# Patient Record
Sex: Female | Born: 1960
Health system: Southern US, Community
[De-identification: ages and names within clinical notes are randomized; demographics above are authoritative.]

## PROBLEM LIST (undated history)

## (undated) DIAGNOSIS — R519 Headache, unspecified: Secondary | ICD-10-CM

## (undated) DIAGNOSIS — M1990 Unspecified osteoarthritis, unspecified site: Secondary | ICD-10-CM

## (undated) DIAGNOSIS — E079 Disorder of thyroid, unspecified: Secondary | ICD-10-CM

## (undated) DIAGNOSIS — C4491 Basal cell carcinoma of skin, unspecified: Secondary | ICD-10-CM

## (undated) DIAGNOSIS — C50919 Malignant neoplasm of unspecified site of unspecified female breast: Secondary | ICD-10-CM

## (undated) DIAGNOSIS — Z923 Personal history of irradiation: Secondary | ICD-10-CM

## (undated) DIAGNOSIS — R609 Edema, unspecified: Secondary | ICD-10-CM

## (undated) DIAGNOSIS — E785 Hyperlipidemia, unspecified: Secondary | ICD-10-CM

## (undated) DIAGNOSIS — R51 Headache: Secondary | ICD-10-CM

## (undated) DIAGNOSIS — D649 Anemia, unspecified: Secondary | ICD-10-CM

## (undated) HISTORY — PX: ABDOMINAL HYSTERECTOMY: SHX81

## (undated) HISTORY — PX: KNEE ARTHROSCOPY: SHX127

## (undated) HISTORY — DX: Anemia, unspecified: D64.9

## (undated) HISTORY — DX: Hyperlipidemia, unspecified: E78.5

## (undated) HISTORY — PX: EYE SURGERY: SHX253

## (undated) HISTORY — DX: Disorder of thyroid, unspecified: E07.9

## (undated) HISTORY — DX: Basal cell carcinoma of skin, unspecified: C44.91

## (undated) HISTORY — DX: Edema, unspecified: R60.9

## (undated) HISTORY — DX: Headache, unspecified: R51.9

## (undated) HISTORY — PX: MANDIBLE SURGERY: SHX707

## (undated) HISTORY — PX: BASAL CELL CARCINOMA EXCISION: SHX1214

## (undated) HISTORY — DX: Unspecified osteoarthritis, unspecified site: M19.90

## (undated) HISTORY — PX: TUBAL LIGATION: SHX77

## (undated) HISTORY — PX: COSMETIC SURGERY: SHX468

## (undated) HISTORY — DX: Malignant neoplasm of unspecified site of unspecified female breast: C50.919

## (undated) HISTORY — DX: Headache: R51

---

## 1996-01-26 HISTORY — PX: BREAST BIOPSY: SHX20

## 2003-01-26 DIAGNOSIS — C50919 Malignant neoplasm of unspecified site of unspecified female breast: Secondary | ICD-10-CM

## 2003-01-26 DIAGNOSIS — Z923 Personal history of irradiation: Secondary | ICD-10-CM

## 2003-01-26 HISTORY — PX: BREAST BIOPSY: SHX20

## 2003-01-26 HISTORY — PX: REDUCTION MAMMAPLASTY: SUR839

## 2003-01-26 HISTORY — PX: BREAST LUMPECTOMY: SHX2

## 2003-01-26 HISTORY — DX: Malignant neoplasm of unspecified site of unspecified female breast: C50.919

## 2003-01-26 HISTORY — DX: Personal history of irradiation: Z92.3

## 2004-01-26 HISTORY — PX: PARTIAL HYSTERECTOMY: SHX80

## 2016-01-26 HISTORY — PX: FOOT SURGERY: SHX648

## 2017-10-07 ENCOUNTER — Ambulatory Visit: Payer: Commercial Managed Care - PPO | Admitting: Primary Care

## 2017-10-07 ENCOUNTER — Encounter: Payer: Self-pay | Admitting: Primary Care

## 2017-10-07 VITALS — BP 118/72 | HR 65 | Temp 98.0°F | Ht 67.0 in | Wt 133.0 lb

## 2017-10-07 DIAGNOSIS — Z23 Encounter for immunization: Secondary | ICD-10-CM | POA: Diagnosis not present

## 2017-10-07 DIAGNOSIS — M79602 Pain in left arm: Secondary | ICD-10-CM | POA: Insufficient documentation

## 2017-10-07 DIAGNOSIS — R600 Localized edema: Secondary | ICD-10-CM

## 2017-10-07 HISTORY — DX: Pain in left arm: M79.602

## 2017-10-07 MED ORDER — PREDNISONE 20 MG PO TABS: ORAL_TABLET | ORAL | 0 refills | Status: DC

## 2017-10-07 NOTE — Assessment & Plan Note (Signed)
Chronic for years, managed on HCTZ for this.  Continue same. BP stable. Monitor BMP.

## 2017-10-07 NOTE — Progress Notes (Signed)
Subjective:    Patient ID: Hannah Rivas, female    DOB: Jul 01, 1960, 57 y.o.   MRN: [id number]  HPI  [name] is a 57 year old female who presents today to establish care and discuss the problems mentioned below. Will obtain old records.   1) Excess Edema: Located to bilateral upper and lower extremities. diagnosed years ago and was told that this was hormonal. Currently managed on HCTZ 25 mg and feels well managed.   BP Readings from Last 3 Encounters:  10/07/17 118/72   2) Left Extremity Pain: Symptoms of discomfort to the left upper trapezius, initially started with "knots to the back" in that area. She went for massage which resolved the knots. Also with pain and numbness/tingling to the left mid upper arm with radiation down to the ulnar wrist.   She's been taking Advil 600 mg BID daily for two weeks with some improvement. She's also be resting by not going to exercise class. She thinks her symptoms are secondary to frequently laying onto her left side on the floor as she had no furniture. She recently moved from [place] and had her furniture moved to [place] weeks before they moved to [place]. Given no furniture she had been leaning onto her left arm and elbow on carpet when at home.  She works on Radio producer daily throughout the day. She denies neck and shoulder pain, recent trauma.    Review of Systems  Musculoskeletal: Negative for back pain and neck pain.       Left upper extremity pain  Skin: Negative for color change.  Neurological: Positive for numbness.       Past Medical History:  Diagnosis Date  . Basal cell carcinoma   . Breast cancer ([place]) 2005   Left  . Edema      Social History   Socioeconomic History  . Marital status: Married    Spouse name: Not on file  . Number of children: Not on file  . Years of education: Not on file  . Highest education level: Not on file  Occupational History  . Not on file  Social Needs  . Financial resource strain: Not on  file  . Food insecurity:    Worry: Not on file    Inability: Not on file  . Transportation needs:    Medical: Not on file    Non-medical: Not on file  Tobacco Use  . Smoking status: Never Smoker  . Smokeless tobacco: Never Used  Substance and Sexual Activity  . Alcohol use: Yes  . Drug use: Not on file  . Sexual activity: Not on file  Lifestyle  . Physical activity:    Days per week: Not on file    Minutes per session: Not on file  . Stress: Not on file  Relationships  . Social connections:    Talks on phone: Not on file    Gets together: Not on file    Attends religious service: Not on file    Active member of club or organization: Not on file    Attends meetings of clubs or organizations: Not on file    Relationship status: Not on file  . Intimate partner violence:    Fear of current or ex partner: Not on file    Emotionally abused: Not on file    Physically abused: Not on file    Forced sexual activity: Not on file  Other Topics Concern  . Not on file  Social History Narrative  .  Not on file    Past Surgical History:  Procedure Laterality Date  . BREAST LUMPECTOMY Left 2005  . FOOT SURGERY Left 2018  . PARTIAL HYSTERECTOMY  2006    Family History  Problem Relation Age of Onset  . Heart attack Mother   . Stroke Father   . Glaucoma Father   . Hypertension Father     No Known Allergies  Current Outpatient Medications on File Prior to Visit  Medication Sig Dispense Refill  . hydrochlorothiazide (HYDRODIURIL) 25 MG tablet Take by mouth.     No current facility-administered medications on file prior to visit.     BP 118/72   Pulse 65   Temp 98 F (36.7 C) (Oral)   Ht 5\' 7"  (1.702 m)   Wt 133 lb (60.3 kg)   SpO2 99%   BMI 20.83 kg/m    Objective:   Physical Exam  Constitutional: She appears well-nourished.  Neck: Normal range of motion. Neck supple.  Cardiovascular: Normal rate and regular rhythm.  Respiratory: Effort normal and breath sounds  normal.  Musculoskeletal:       Left shoulder: She exhibits normal range of motion, no tenderness, no pain and normal strength.  5/5 strength to bilateral upper extremities   Skin: Skin is warm and dry.           Assessment & Plan:

## 2017-10-07 NOTE — Assessment & Plan Note (Signed)
Present for the last three weeks, some improvement overall but intermittent tingling. Exam today unremarkable. No evidence of blood clot, neck/shoulder involvement.  Rx for prednisone taper sent to pharmacy. She will update.

## 2017-10-07 NOTE — Patient Instructions (Signed)
Start prednisone tablets for arm numbness/pain. Take 2 tablets daily for 4 days, then 1 tablet daily for 4 days. Avoid Ibuprofen, Aleve, Motrin, naproxen.   Continue to rest your arm for now.   Please update me if your pain persists.   Please schedule a physical with me anytime at your convenience. You may also schedule a lab only appointment 3-4 days prior. We will discuss your lab results in detail during your physical.  It was a pleasure to meet you today! Please don't hesitate to call or message me with any questions. Welcome to [facility]!

## 2017-10-07 NOTE — Addendum Note (Signed)
Addended by: Jacqualin Combes on: 10/07/2017 11:51 AM   Modules accepted: Orders

## 2017-10-10 ENCOUNTER — Telehealth: Payer: Self-pay | Admitting: Primary Care

## 2017-10-10 DIAGNOSIS — M79602 Pain in left arm: Secondary | ICD-10-CM

## 2017-10-10 MED ORDER — PREDNISONE 20 MG PO TABS: ORAL_TABLET | ORAL | 0 refills | Status: DC

## 2017-10-10 NOTE — Telephone Encounter (Signed)
rx resent  

## 2017-10-10 NOTE — Telephone Encounter (Signed)
Copied from [place] [id number]. Topic: Quick Communication - See Telephone Encounter >> Oct 10, 2017  2:31 PM Ivar Drape wrote: CRM for notification. See Telephone encounter for: 10/10/17. Patient stated her predniSONE (DELTASONE) 20 MG tablet medication went to the [facility] but [facility] [address].

## 2017-11-15 ENCOUNTER — Encounter: Payer: Self-pay | Admitting: Primary Care

## 2017-11-15 ENCOUNTER — Ambulatory Visit (INDEPENDENT_AMBULATORY_CARE_PROVIDER_SITE_OTHER): Payer: Commercial Managed Care - PPO | Admitting: Primary Care

## 2017-11-15 VITALS — BP 98/60 | HR 73 | Temp 98.1°F | Ht 66.5 in | Wt 134.0 lb

## 2017-11-15 DIAGNOSIS — R600 Localized edema: Secondary | ICD-10-CM | POA: Diagnosis not present

## 2017-11-15 DIAGNOSIS — Z1239 Encounter for other screening for malignant neoplasm of breast: Secondary | ICD-10-CM

## 2017-11-15 DIAGNOSIS — Z0000 Encounter for general adult medical examination without abnormal findings: Secondary | ICD-10-CM | POA: Diagnosis not present

## 2017-11-15 DIAGNOSIS — Z1159 Encounter for screening for other viral diseases: Secondary | ICD-10-CM | POA: Diagnosis not present

## 2017-11-15 DIAGNOSIS — Z0001 Encounter for general adult medical examination with abnormal findings: Secondary | ICD-10-CM | POA: Insufficient documentation

## 2017-11-15 NOTE — Progress Notes (Signed)
Subjective:    Patient ID: Hannah Rivas, female    DOB: 1960-09-22, 57 y.o.   MRN: [id number]  HPI  Hannah Rivas is a 57 year old female who presents today for complete physical.  Immunizations: -Tetanus: Unsure, thinks it's been over 10 years. Declines.  -Influenza: Completed in 2019   Diet: She endorses a fair diet. Breakfast: Yogurt, fruit Lunch: Salad Dinner: Pizza, vegetables, some starch, some protein  Snacks: Nuts, fruit, popcorn  Desserts: Cookie, three times weekly  Beverages: Water, coffee, milk, cocktail several times weekly  Exercise: Yoga three times weekly, will be going to boot camp. Eye exam: Completed in 2019 Dental exam: Completes semi-annually  Colonoscopy: Due in 2020 Pap Smear: Hysterectomy  Mammogram: Due  Hep C Screen: Due today  BP Readings from Last 3 Encounters:  11/15/17 98/60  10/07/17 118/72     Review of Systems  Constitutional: Negative for unexpected weight change.  HENT: Negative for rhinorrhea.   Respiratory: Negative for cough and shortness of breath.   Cardiovascular: Negative for chest pain.       Intermittent lower extremity edema, chronic.   Gastrointestinal: Negative for constipation and diarrhea.  Genitourinary: Negative for difficulty urinating and menstrual problem.  Musculoskeletal: Negative for arthralgias and myalgias.  Skin: Negative for rash.  Allergic/Immunologic: Negative for environmental allergies.  Neurological: Negative for dizziness, numbness and headaches.  Psychiatric/Behavioral: The patient is not nervous/anxious.        Past Medical History:  Diagnosis Date  . Arthritis   . Basal cell carcinoma   . Breast cancer ([place]) 2005   Left  . Edema   . Frequent headaches      Social History   Socioeconomic History  . Marital status: Married    Spouse name: Not on file  . Number of children: Not on file  . Years of education: Not on file  . Highest education level: Not on file  Occupational  History  . Not on file  Social Needs  . Financial resource strain: Not on file  . Food insecurity:    Worry: Not on file    Inability: Not on file  . Transportation needs:    Medical: Not on file    Non-medical: Not on file  Tobacco Use  . Smoking status: Never Smoker  . Smokeless tobacco: Never Used  Substance and Sexual Activity  . Alcohol use: Yes  . Drug use: Not on file  . Sexual activity: Not on file  Lifestyle  . Physical activity:    Days per week: Not on file    Minutes per session: Not on file  . Stress: Not on file  Relationships  . Social connections:    Talks on phone: Not on file    Gets together: Not on file    Attends religious service: Not on file    Active member of club or organization: Not on file    Attends meetings of clubs or organizations: Not on file    Relationship status: Not on file  . Intimate partner violence:    Fear of current or ex partner: Not on file    Emotionally abused: Not on file    Physically abused: Not on file    Forced sexual activity: Not on file  Other Topics Concern  . Not on file  Social History Narrative  . Not on file    Past Surgical History:  Procedure Laterality Date  . BASAL CELL CARCINOMA EXCISION  2012, 2015  .  BREAST LUMPECTOMY Left 2005  . FOOT SURGERY Left 2018  . PARTIAL HYSTERECTOMY  2006    Family History  Problem Relation Age of Onset  . Heart attack Mother   . Stroke Father   . Glaucoma Father   . Hypertension Father     No Known Allergies  Current Outpatient Medications on File Prior to Visit  Medication Sig Dispense Refill  . hydrochlorothiazide (HYDRODIURIL) 25 MG tablet Take by mouth.     No current facility-administered medications on file prior to visit.     BP 98/60 (BP Location: Left Arm, Patient Position: Sitting, Cuff Size: Normal)   Pulse 73   Temp 98.1 F (36.7 C) (Oral)   Ht 5' 6.5" (1.689 m)   Wt 134 lb (60.8 kg)   SpO2 99%   BMI 21.30 kg/m    Objective:    Physical Exam  Constitutional: She is oriented to person, place, and time. She appears well-nourished.  HENT:  Mouth/Throat: No oropharyngeal exudate.  Eyes: Pupils are equal, round, and reactive to light. EOM are normal.  Neck: Neck supple. No thyromegaly present.  Cardiovascular: Normal rate and regular rhythm.  Respiratory: Effort normal and breath sounds normal.  GI: Soft. Bowel sounds are normal. There is no tenderness.  Musculoskeletal: Normal range of motion.  Neurological: She is alert and oriented to person, place, and time.  Skin: Skin is warm and dry.  Psychiatric: She has a normal mood and affect.           Assessment & Plan:

## 2017-11-15 NOTE — Patient Instructions (Addendum)
Stop by the lab prior to leaving today. I will notify you of your results once received.   Call the [facility] to schedule your mammogram.   Continue exercising. You should be getting 150 minutes of moderate intensity exercise weekly.  It's important to improve your diet by reducing consumption of fast food, fried food, processed snack foods, sugary drinks. Increase consumption of fresh vegetables and fruits, whole grains, water.  Ensure you are drinking 64 ounces of water daily.  We will see you in one year for your annual exam or sooner if needed.  It was a pleasure to see you today!   Preventive Care 40-64 Years, Female Preventive care refers to lifestyle choices and visits with your health care provider that can promote health and wellness. What does preventive care include?  A yearly physical exam. This is also called an annual well check.  Dental exams once or twice a year.  Routine eye exams. Ask your health care provider how often you should have your eyes checked.  Personal lifestyle choices, including: ? Daily care of your teeth and gums. ? Regular physical activity. ? Eating a healthy diet. ? Avoiding tobacco and drug use. ? Limiting alcohol use. ? Practicing safe sex. ? Taking low-dose aspirin daily starting at age 7. ? Taking vitamin and mineral supplements as recommended by your health care provider. What happens during an annual well check? The services and screenings done by your health care provider during your annual well check will depend on your age, overall health, lifestyle risk factors, and family history of disease. Counseling Your health care provider may ask you questions about your:  Alcohol use.  Tobacco use.  Drug use.  Emotional well-being.  Home and relationship well-being.  Sexual activity.  Eating habits.  Work and work Statistician.  Method of birth control.  Menstrual cycle.  Pregnancy history.  Screening You  may have the following tests or measurements:  Height, weight, and BMI.  Blood pressure.  Lipid and cholesterol levels. These may be checked every 5 years, or more frequently if you are over 55 years old.  Skin check.  Lung cancer screening. You may have this screening every year starting at age 61 if you have a 30-pack-year history of smoking and currently smoke or have quit within the past 15 years.  Fecal occult blood test (FOBT) of the stool. You may have this test every year starting at age 52.  Flexible sigmoidoscopy or colonoscopy. You may have a sigmoidoscopy every 5 years or a colonoscopy every 10 years starting at age 98.  Hepatitis C blood test.  Hepatitis B blood test.  Sexually transmitted disease (STD) testing.  Diabetes screening. This is done by checking your blood sugar (glucose) after you have not eaten for a while (fasting). You may have this done every 1-3 years.  Mammogram. This may be done every 1-2 years. Talk to your health care provider about when you should start having regular mammograms. This may depend on whether you have a family history of breast cancer.  BRCA-related cancer screening. This may be done if you have a family history of breast, ovarian, tubal, or peritoneal cancers.  Pelvic exam and Pap test. This may be done every 3 years starting at age 29. Starting at age 2, this may be done every 5 years if you have a Pap test in combination with an HPV test.  Bone density scan. This is done to screen for osteoporosis. You may have this  scan if you are at high risk for osteoporosis.  Discuss your test results, treatment options, and if necessary, the need for more tests with your health care provider. Vaccines Your health care provider may recommend certain vaccines, such as:  Influenza vaccine. This is recommended every year.  Tetanus, diphtheria, and acellular pertussis (Tdap, Td) vaccine. You may need a Td booster every 10 years.  Varicella  vaccine. You may need this if you have not been vaccinated.  Zoster vaccine. You may need this after age 46.  Measles, mumps, and rubella (MMR) vaccine. You may need at least one dose of MMR if you were born in 1957 or later. You may also need a second dose.  Pneumococcal 13-valent conjugate (PCV13) vaccine. You may need this if you have certain conditions and were not previously vaccinated.  Pneumococcal polysaccharide (PPSV23) vaccine. You may need one or two doses if you smoke cigarettes or if you have certain conditions.  Meningococcal vaccine. You may need this if you have certain conditions.  Hepatitis A vaccine. You may need this if you have certain conditions or if you travel or work in places where you may be exposed to hepatitis A.  Hepatitis B vaccine. You may need this if you have certain conditions or if you travel or work in places where you may be exposed to hepatitis B.  Haemophilus influenzae type b (Hib) vaccine. You may need this if you have certain conditions.  Talk to your health care provider about which screenings and vaccines you need and how often you need them. This information is not intended to replace advice given to you by your health care provider. Make sure you discuss any questions you have with your health care provider. Document Released: 02/07/2015 Document Revised: 10/01/2015 Document Reviewed: 11/12/2014 Elsevier Interactive Patient Education  [employer].

## 2017-11-15 NOTE — Assessment & Plan Note (Signed)
Patient endorses Td is UTD. Influenza vaccination UTD. Mammogram due, ordered.  Colonoscopy due in 2020. Discussed to work on diet, continue with regular exercise.  Exam unremarkable. Labs pending. Follow up in 1 year for CPE.

## 2017-11-15 NOTE — Assessment & Plan Note (Signed)
Intermittent, doing well on HCTZ 12.5 mg. Continue to monitor. Discussed to elevate extremities.

## 2017-11-16 LAB — COMPREHENSIVE METABOLIC PANEL
ALK PHOS: 52 U/L (ref 39–117)
ALT: 15 U/L (ref 0–35)
AST: 21 U/L (ref 0–37)
Albumin: 4.5 g/dL (ref 3.5–5.2)
BILIRUBIN TOTAL: 0.3 mg/dL (ref 0.2–1.2)
BUN: 17 mg/dL (ref 6–23)
CALCIUM: 9.8 mg/dL (ref 8.4–10.5)
CO2: 33 mEq/L — ABNORMAL HIGH (ref 19–32)
CREATININE: 0.83 mg/dL (ref 0.40–1.20)
Chloride: 99 mEq/L (ref 96–112)
GFR: 74.79 mL/min (ref >60.00)
Glucose, Bld: 77 mg/dL (ref 70–99)
Potassium: 3.6 mEq/L (ref 3.5–5.1)
Sodium: 140 mEq/L (ref 135–145)
TOTAL PROTEIN: 7.3 g/dL (ref 6.0–8.3)

## 2017-11-16 LAB — LIPID PANEL
CHOL/HDL RATIO: 3
CHOLESTEROL: 216 mg/dL — AB (ref 0–200)
HDL: 78.4 mg/dL (ref >39.00)
LDL Cholesterol: 116 mg/dL — ABNORMAL HIGH (ref 0–99)
NonHDL: 137.93
TRIGLYCERIDES: 111 mg/dL (ref 0.0–149.0)
VLDL: 22.2 mg/dL (ref 0.0–40.0)

## 2017-11-16 LAB — VITAMIN D 25 HYDROXY (VIT D DEFICIENCY, FRACTURES): VITD: 37.85 ng/mL (ref 30.00–100.00)

## 2017-11-16 LAB — HEPATITIS C ANTIBODY
Hepatitis C Ab: NONREACTIVE
SIGNAL TO CUT-OFF: 0.01 (ref <1.00)

## 2017-11-18 ENCOUNTER — Encounter: Payer: Self-pay | Admitting: *Deleted

## 2017-12-05 ENCOUNTER — Other Ambulatory Visit: Payer: Self-pay | Admitting: Primary Care

## 2017-12-05 DIAGNOSIS — N6489 Other specified disorders of breast: Secondary | ICD-10-CM

## 2017-12-08 ENCOUNTER — Telehealth: Payer: Self-pay | Admitting: Primary Care

## 2017-12-08 NOTE — Telephone Encounter (Signed)
Pt dropped off Health Screening form to be filled out for ins. Last cpe 11/15/17. I placed in Rx tower.

## 2017-12-08 NOTE — Telephone Encounter (Signed)
Completed and placed in Chans inbox. 

## 2017-12-08 NOTE — Telephone Encounter (Signed)
Place form/paperwork in Kate Clark's inbox for review and complete if necessary.  

## 2017-12-09 NOTE — Telephone Encounter (Signed)
Spoken to patient and she will stop by for the waist measurement.

## 2017-12-09 NOTE — Telephone Encounter (Signed)
Measurement done. Form faxed.

## 2017-12-27 ENCOUNTER — Ambulatory Visit
Admission: RE | Admit: 2017-12-27 | Discharge: 2017-12-27 | Disposition: A | Discharge status: Home / Self Care | Payer: Commercial Managed Care - PPO | Source: Ambulatory Visit | Attending: Primary Care | Admitting: Primary Care

## 2017-12-27 DIAGNOSIS — N6489 Other specified disorders of breast: Secondary | ICD-10-CM

## 2017-12-27 HISTORY — DX: Personal history of irradiation: Z92.3

## 2017-12-29 ENCOUNTER — Other Ambulatory Visit: Payer: Self-pay

## 2017-12-29 DIAGNOSIS — R600 Localized edema: Secondary | ICD-10-CM

## 2017-12-29 NOTE — Telephone Encounter (Signed)
Noted, please clarify dose with patient. Is she taking 1/2 of a 25 mg tablet or is she taking the full 25 mg tablet? Has she always done this?

## 2017-12-29 NOTE — Telephone Encounter (Signed)
Pt last seen 11/15/17 for annual exam; pt requesting rx for HCTZ 25 mg taking one tab po daily to [facility]. In 11/15/17 office note it was noted pt doing well on  HCTZ 12.5 mg.K Clark NP has not prescribed before. Please advise.

## 2017-12-30 MED ORDER — HYDROCHLOROTHIAZIDE 25 MG PO TABS: 25.0000 mg | ORAL_TABLET | Freq: Every day | ORAL | 3 refills | Status: DC

## 2017-12-30 NOTE — Telephone Encounter (Signed)
Patient called back and she stated that she takes a full 25 mg tablet daily. She did a while back take 12.5 mg but that was before she came here, she start taking the 25 mg.

## 2017-12-30 NOTE — Telephone Encounter (Signed)
Spoken and notified patient of Kate Clark's comments. Patient verbalized understanding.  

## 2017-12-30 NOTE — Telephone Encounter (Signed)
Noted, refill sent to pharmacy. 

## 2018-03-10 ENCOUNTER — Ambulatory Visit: Payer: Self-pay | Admitting: *Deleted

## 2018-03-10 NOTE — Telephone Encounter (Signed)
Spoke with pt relaying Kate's message.  Pt verbalizes understanding.  

## 2018-03-10 NOTE — Telephone Encounter (Signed)
Patient notified as instructed by telephone and verbalized understanding. Patient stated that she has been going to the gym three times a week and wants to know if that is okay? Patient stated that she went to the gym yesterday about 4:30 and was fine. Patient stated her symptoms started about 4:00 this am. Please advise regarding going to the gym.

## 2018-03-10 NOTE — Telephone Encounter (Signed)
Please notify patient that it's okay to wait, but if symptoms returns then needs evaluation in our office. Also Have her monitor BP and HR. Come in if BP >140/90 or HR >100 consistently. Increase water intake to 64 ounces daily.

## 2018-03-10 NOTE — Telephone Encounter (Signed)
Pt reports one episode of "Heart racing, pounding, palpitations" onset this AM at 0400, duration one hour. Denies any CP, tightness; states "Little short of breath but I was anxious." Also reports "When first got out of bed a little lightheaded" resolved quickly. States may not have been staying adequately hydrated. No symptoms presently, HR during call 96. Pt reports made appt with cardiologist for 04/12/2018, Dr. Rockey Situ. Pt questioning if OK to wait until then or been seen at practice. Directed to ED if symptoms reoccur.  Please advise re: appointment.  CB # [id number]  Reason for Disposition . [1] Heart beating very rapidly (e.g., > 140 / minute) AND [2] not present now  (Exception: during exercise)  Answer Assessment - Initial Assessment Questions 1. DESCRIPTION: "Please describe your heart rate or heart beat that you are having" (e.g., fast/slow, regular/irregular, skipped or extra beats, "palpitations")     Fast, "Irregular" 2. ONSET: "When did it start?" (Minutes, hours or days)      0400 for 1 hour 3. DURATION: "How long does it last" (e.g., seconds, minutes, hours)     1 hour 4. PATTERN "Does it come and go, or has it been constant since it started?"  "Does it get worse with exertion?"   "Are you feeling it now?"     One episode only this am 5. TAP: "Using your hand, can you tap out what you are feeling on a chair or table in front of you, so that I can hear?" (Note: not all patients can do this)       no 6. HEART RATE: "Can you tell me your heart rate?" "How many beats in 15 seconds?"  (Note: not all patients can do this)      96 7. RECURRENT SYMPTOM: "Have you ever had this before?" If so, ask: "When was the last time?" and "What happened that time?"     no 8. CAUSE: "What do you think is causing the palpitations?"     unsure 9. CARDIAC HISTORY: "Do you have any history of heart disease?" (e.g., heart attack, angina, bypass surgery, angioplasty, arrhythmia)      no 10. OTHER  SYMPTOMS: "Do you have any other symptoms?" (e.g., dizziness, chest pain, sweating, difficulty breathing)       Mild lightheadedness when got out of bed, resolved quickly  Protocols used: [place]

## 2018-03-10 NOTE — Telephone Encounter (Signed)
That should be fine as long as she doesn't experience symptoms. Make sure she hydrates well.

## 2018-03-14 DIAGNOSIS — L821 Other seborrheic keratosis: Secondary | ICD-10-CM | POA: Diagnosis not present

## 2018-03-14 DIAGNOSIS — Z1283 Encounter for screening for malignant neoplasm of skin: Secondary | ICD-10-CM | POA: Diagnosis not present

## 2018-03-14 DIAGNOSIS — L820 Inflamed seborrheic keratosis: Secondary | ICD-10-CM | POA: Diagnosis not present

## 2018-03-14 DIAGNOSIS — Z85828 Personal history of other malignant neoplasm of skin: Secondary | ICD-10-CM | POA: Diagnosis not present

## 2018-03-14 DIAGNOSIS — L570 Actinic keratosis: Secondary | ICD-10-CM | POA: Diagnosis not present

## 2018-04-12 ENCOUNTER — Encounter

## 2018-04-12 ENCOUNTER — Ambulatory Visit: Payer: Commercial Managed Care - PPO | Admitting: Cardiovascular Disease

## 2018-09-07 ENCOUNTER — Other Ambulatory Visit: Payer: Self-pay | Admitting: Primary Care

## 2018-09-07 DIAGNOSIS — Z1231 Encounter for screening mammogram for malignant neoplasm of breast: Secondary | ICD-10-CM

## 2018-10-17 ENCOUNTER — Ambulatory Visit (INDEPENDENT_AMBULATORY_CARE_PROVIDER_SITE_OTHER): Payer: Commercial Managed Care - PPO | Admitting: Family Medicine

## 2018-10-17 ENCOUNTER — Encounter: Payer: Self-pay | Admitting: Family Medicine

## 2018-10-17 DIAGNOSIS — J0100 Acute maxillary sinusitis, unspecified: Secondary | ICD-10-CM

## 2018-10-17 DIAGNOSIS — J019 Acute sinusitis, unspecified: Secondary | ICD-10-CM | POA: Insufficient documentation

## 2018-10-17 MED ORDER — AMOXICILLIN-POT CLAVULANATE 875-125 MG PO TABS: 1.0000 | ORAL_TABLET | Freq: Two times a day (BID) | ORAL | 0 refills | Status: DC

## 2018-10-17 NOTE — Progress Notes (Signed)
Virtual Visit via Video Note  I connected with Hannah Rivas on 10/17/18 at 11:30 AM EDT by a video enabled telemedicine application and verified that I am speaking with the correct person using two identifiers.  Location: Patient: home Provider: office     I discussed the limitations of evaluation and management by telemedicine and the availability of in person appointments. The patient expressed understanding and agreed to proceed.  History of Present Illness: 58 yo pt of NP [facility] here with sinus symptoms   Has h/o sinus problems for 30 years   Right before labor day she got congested with sinus headache  Ears feel full  A bit dizzy Comes and goes  Now headache is behind L eye -often gets pain there   Congested but nothing comes out of her nose  All drains down the back of her throat  Some light green color to mucous   No fever  No loss of taste or smell   No diarrhea  No abd pain  No nausea   No known covid exposures  Not in the public often   Tender in corners of eyes and also cheeks -when she presses  Worse on the L No hearing change  Gets woozy at times  Not so bad the room spins  One time she vomited due to so much drainage and dizziness     Decongestant -[facility] brand (pseudoephedrine)  Uses flonase  - every day though the allergy season  Uses netti pot with saline and this helps  Not using any antihistamines    Patient Active Problem List   Diagnosis Date Noted  . Acute sinusitis 10/17/2018  . Preventative health care 11/15/2017  . Pain of left upper extremity 10/07/2017  . Extremity edema 10/07/2017   Past Medical History:  Diagnosis Date  . Arthritis   . Basal cell carcinoma   . Breast cancer ([place]) 2005   Left  . Edema   . Frequent headaches   . Personal history of radiation therapy 2005   Left breast   Past Surgical History:  Procedure Laterality Date  . BASAL CELL CARCINOMA EXCISION  2012, 2015  . BREAST BIOPSY Left 2005   Positive   . BREAST BIOPSY Right 1998   neg  . BREAST LUMPECTOMY Left 2005  . FOOT SURGERY Left 2018  . PARTIAL HYSTERECTOMY  2006  . REDUCTION MAMMAPLASTY Right 2005   Social History   Tobacco Use  . Smoking status: Never Smoker  . Smokeless tobacco: Never Used  Substance Use Topics  . Alcohol use: Yes  . Drug use: Not on file   Family History  Problem Relation Age of Onset  . Heart attack Mother   . Stroke Father   . Glaucoma Father   . Hypertension Father   . Breast cancer Maternal Aunt    No Known Allergies Current Outpatient Medications on File Prior to Visit  Medication Sig Dispense Refill  . hydrochlorothiazide (HYDRODIURIL) 25 MG tablet Take 1 tablet (25 mg total) by mouth daily. 90 tablet 3   No current facility-administered medications on file prior to visit.    Review of Systems  Constitutional: Negative for chills, diaphoresis, fever and malaise/fatigue.  HENT: Positive for congestion and sinus pain. Negative for ear discharge, ear pain and hearing loss.        Post nasal drip   Eyes: Negative for discharge and redness.  Respiratory: Negative for cough, shortness of breath and stridor.   Cardiovascular: Negative for chest  pain and palpitations.  Gastrointestinal: Negative for abdominal pain and diarrhea.  Skin: Negative for rash.  Neurological: Positive for dizziness and headaches.    Observations/Objective: Able to see patient initially-then picture on the screen Patient appears well, in no distress Weight is baseline  No facial swelling or asymmetry Normal voice-not hoarse and no slurred speec Able to hear the call well  No cough or shortness of breath during interview  Talkative and mentally sharp with no cognitive changes No skin changes on face or neck , no rash or pallor Affect is normal   Assessment and Plan: Problem List Items Addressed This Visit      Respiratory   Acute sinusitis    In pt with allergic rhinitis -now L sided sinus pain and  tenderness with post nasal drip and ear pressure Px augmentin bid for 7 d  Fluids/rest/netti pot  Continue flonase Avoid pseudoephedrine at night-it can cause trouble sleeping  Isolate until feeling better  Disc s/s of covid to watch for  Would consider testing if worse or if new symptoms-inst to let us know Update if not starting to improve in a week or if worsening        Relevant Medications   amoxicillin-clavulanate (AUGMENTIN) 875-125 MG tablet       Follow Up Instructions: I think you have a sinus infection  Drink fluids and rest when you can  Take the augmentin as directed with food  Continue flonase nasal spray  Also use netti pot if helpful   If you develop new symptoms like loss of taste or smell, cough or shortness or breath or fever let us know immediately  We would consider covid testing  Also if not improving in 7 days   Update if not starting to improve in a week or if worsening     I discussed the assessment and treatment plan with the patient. The patient was provided an opportunity to ask questions and all were answered. The patient agreed with the plan and demonstrated an understanding of the instructions.   The patient was advised to call back or seek an in-person evaluation if the symptoms worsen or if the condition fails to improve as anticipated.     Loura Pardon, MD

## 2018-10-17 NOTE — Assessment & Plan Note (Signed)
In pt with allergic rhinitis -now L sided sinus pain and tenderness with post nasal drip and ear pressure Px augmentin bid for 7 d  Fluids/rest/netti pot  Continue flonase Avoid pseudoephedrine at night-it can cause trouble sleeping  Isolate until feeling better  Disc s/s of covid to watch for  Would consider testing if worse or if new symptoms-inst to let us know Update if not starting to improve in a week or if worsening

## 2018-10-17 NOTE — Patient Instructions (Addendum)
I think you have a sinus infection  Drink fluids and rest when you can  Take the augmentin as directed with food  Continue flonase nasal spray  Also use netti pot if helpful   If you develop new symptoms like loss of taste or smell, cough or shortness or breath or fever let us know immediately  We would consider covid testing  Also if not improving in 7 days   Update if not starting to improve in a week or if worsening

## 2018-10-27 ENCOUNTER — Other Ambulatory Visit: Payer: Self-pay

## 2018-10-27 ENCOUNTER — Ambulatory Visit (INDEPENDENT_AMBULATORY_CARE_PROVIDER_SITE_OTHER): Payer: Commercial Managed Care - PPO

## 2018-10-27 DIAGNOSIS — Z23 Encounter for immunization: Secondary | ICD-10-CM | POA: Diagnosis not present

## 2018-11-07 ENCOUNTER — Other Ambulatory Visit: Payer: Self-pay | Admitting: Primary Care

## 2018-11-07 DIAGNOSIS — Z0000 Encounter for general adult medical examination without abnormal findings: Secondary | ICD-10-CM

## 2018-11-07 DIAGNOSIS — E785 Hyperlipidemia, unspecified: Secondary | ICD-10-CM

## 2018-11-07 DIAGNOSIS — Z125 Encounter for screening for malignant neoplasm of prostate: Secondary | ICD-10-CM

## 2018-11-16 ENCOUNTER — Other Ambulatory Visit: Payer: Self-pay

## 2018-11-16 ENCOUNTER — Other Ambulatory Visit (INDEPENDENT_AMBULATORY_CARE_PROVIDER_SITE_OTHER): Payer: Commercial Managed Care - PPO

## 2018-11-16 DIAGNOSIS — E785 Hyperlipidemia, unspecified: Secondary | ICD-10-CM | POA: Diagnosis not present

## 2018-11-16 LAB — LIPID PANEL
Cholesterol: 207 mg/dL — ABNORMAL HIGH (ref 0–200)
HDL: 74 mg/dL (ref >39.00)
LDL Cholesterol: 122 mg/dL — ABNORMAL HIGH (ref 0–99)
NonHDL: 133.35
Total CHOL/HDL Ratio: 3
Triglycerides: 56 mg/dL (ref 0.0–149.0)
VLDL: 11.2 mg/dL (ref 0.0–40.0)

## 2018-11-16 LAB — COMPREHENSIVE METABOLIC PANEL
ALT: 13 U/L (ref 0–35)
AST: 20 U/L (ref 0–37)
Albumin: 4.3 g/dL (ref 3.5–5.2)
Alkaline Phosphatase: 55 U/L (ref 39–117)
BUN: 18 mg/dL (ref 6–23)
CO2: 32 mEq/L (ref 19–32)
Calcium: 9.5 mg/dL (ref 8.4–10.5)
Chloride: 101 mEq/L (ref 96–112)
Creatinine, Ser: 0.9 mg/dL (ref 0.40–1.20)
GFR: 63.88 mL/min (ref >60.00)
Glucose, Bld: 89 mg/dL (ref 70–99)
Potassium: 3.3 mEq/L — ABNORMAL LOW (ref 3.5–5.1)
Sodium: 142 mEq/L (ref 135–145)
Total Bilirubin: 0.5 mg/dL (ref 0.2–1.2)
Total Protein: 6.8 g/dL (ref 6.0–8.3)

## 2018-11-16 LAB — CBC
HCT: 41.3 % (ref 36.0–46.0)
Hemoglobin: 13.6 g/dL (ref 12.0–15.0)
MCHC: 33.1 g/dL (ref 30.0–36.0)
MCV: 83.5 fl (ref 78.0–100.0)
Platelets: 199 10*3/uL (ref 150.0–400.0)
RBC: 4.94 Mil/uL (ref 3.87–5.11)
RDW: 12.8 % (ref 11.5–15.5)
WBC: 4.3 10*3/uL (ref 4.0–10.5)

## 2018-11-21 ENCOUNTER — Other Ambulatory Visit: Payer: Self-pay

## 2018-11-21 ENCOUNTER — Encounter: Payer: Self-pay | Admitting: Primary Care

## 2018-11-21 ENCOUNTER — Ambulatory Visit (INDEPENDENT_AMBULATORY_CARE_PROVIDER_SITE_OTHER): Payer: Commercial Managed Care - PPO | Admitting: Primary Care

## 2018-11-21 VITALS — BP 112/70 | HR 67 | Temp 97.8°F | Ht 66.5 in | Wt 132.0 lb

## 2018-11-21 DIAGNOSIS — Z0000 Encounter for general adult medical examination without abnormal findings: Secondary | ICD-10-CM | POA: Diagnosis not present

## 2018-11-21 DIAGNOSIS — R600 Localized edema: Secondary | ICD-10-CM

## 2018-11-21 DIAGNOSIS — S83209A Unspecified tear of unspecified meniscus, current injury, unspecified knee, initial encounter: Secondary | ICD-10-CM | POA: Insufficient documentation

## 2018-11-21 DIAGNOSIS — M23206 Derangement of unspecified meniscus due to old tear or injury, right knee: Secondary | ICD-10-CM

## 2018-11-21 DIAGNOSIS — G479 Sleep disorder, unspecified: Secondary | ICD-10-CM | POA: Diagnosis not present

## 2018-11-21 DIAGNOSIS — Z23 Encounter for immunization: Secondary | ICD-10-CM | POA: Diagnosis not present

## 2018-11-21 MED ORDER — HYDROCHLOROTHIAZIDE 25 MG PO TABS: 25.0000 mg | ORAL_TABLET | Freq: Every day | ORAL | 3 refills | Status: DC

## 2018-11-21 NOTE — Assessment & Plan Note (Addendum)
Tetanus due, provided today.  Mammogram scheduled for December 2020. Colonoscopy due in 2021. Encouraged regular exercise, healthy diet. Exam today unremarkable. Labs reviewed.

## 2018-11-21 NOTE — Assessment & Plan Note (Signed)
Doing well on HCTZ 25 mg daily. Did notice slight decrease in potassium, she will increase oral dietary potassium. Labs from last year with normal potassium level. Continue to monitor.

## 2018-11-21 NOTE — Assessment & Plan Note (Signed)
Chronic over the last year, could be secondary to stress from work, also sugary foods at bedtime. She will work on both. Discussed use of Melatonin PRN. She will update.

## 2018-11-21 NOTE — Assessment & Plan Note (Signed)
Following with orthopedics, recent injection with improvement.

## 2018-11-21 NOTE — Progress Notes (Signed)
Subjective:    Patient ID: Hannah Rivas, female    DOB: 02-13-1960, 58 y.o.   MRN: [id number]  HPI  Hannah Rivas is a 58 year old female who presents today for complete physical.  She would also like to discuss difficulty sleeping. She is having trouble falling and staying asleep at night. She thinks it may be correlated to desserts at night, also increased stress at work. She's taken Melatonin a few times with improvement. She does watch TV/look at screens prior to bed, will lay in bed if she cannot fall asleep. Symptoms began in early 2020  Immunizations: -Tetanus: Unsure, believes it's been over 10 years.  -Influenza: Completed this season  -Shingles: Completed Zostavax  Diet: She endorses a poor diet. She is eating take out food, home cooked meals. Desserts daily. Drinking water, coffee, cocktails on occasion.  Exercise: She is exercising at home, three days weekly.   Eye exam: Due this week. Dental exam: Completes semi-annually   Pap Smear: Hysterectomy, ovaries remain. Mammogram: Scheduled for December 2020 Colonoscopy: Completed in 2011, due in 2021 Hep C Screen: Negative in 2019  BP Readings from Last 3 Encounters:  11/21/18 112/70  11/15/17 98/60  10/07/17 118/72     Review of Systems  Constitutional: Negative for unexpected weight change.  HENT: Negative for rhinorrhea.   Respiratory: Negative for cough and shortness of breath.   Cardiovascular: Negative for chest pain.  Gastrointestinal: Negative for constipation and diarrhea.  Genitourinary: Negative for difficulty urinating.  Musculoskeletal: Positive for arthralgias. Negative for myalgias.       Chronic right knee pain  Skin: Negative for rash.  Allergic/Immunologic: Negative for environmental allergies.  Neurological: Negative for dizziness, numbness and headaches.  Psychiatric/Behavioral: Positive for sleep disturbance.       Past Medical History:  Diagnosis Date  . Arthritis   . Basal cell  carcinoma   . Breast cancer ([place]) 2005   Left  . Edema   . Frequent headaches   . Personal history of radiation therapy 2005   Left breast     Social History   Socioeconomic History  . Marital status: Married    Spouse name: Not on file  . Number of children: Not on file  . Years of education: Not on file  . Highest education level: Not on file  Occupational History  . Not on file  Social Needs  . Financial resource strain: Not on file  . Food insecurity    Worry: Not on file    Inability: Not on file  . Transportation needs    Medical: Not on file    Non-medical: Not on file  Tobacco Use  . Smoking status: Never Smoker  . Smokeless tobacco: Never Used  Substance and Sexual Activity  . Alcohol use: Yes  . Drug use: Not on file  . Sexual activity: Not on file  Lifestyle  . Physical activity    Days per week: Not on file    Minutes per session: Not on file  . Stress: Not on file  Relationships  . Social Herbalist on phone: Not on file    Gets together: Not on file    Attends religious service: Not on file    Active member of club or organization: Not on file    Attends meetings of clubs or organizations: Not on file    Relationship status: Not on file  . Intimate partner violence    Fear of current or  ex partner: Not on file    Emotionally abused: Not on file    Physically abused: Not on file    Forced sexual activity: Not on file  Other Topics Concern  . Not on file  Social History Narrative  . Not on file    Past Surgical History:  Procedure Laterality Date  . BASAL CELL CARCINOMA EXCISION  2012, 2015  . BREAST BIOPSY Left 2005   Positive  . BREAST BIOPSY Right 1998   neg  . BREAST LUMPECTOMY Left 2005  . FOOT SURGERY Left 2018  . PARTIAL HYSTERECTOMY  2006  . REDUCTION MAMMAPLASTY Right 2005    Family History  Problem Relation Age of Onset  . Heart attack Mother   . Stroke Father   . Glaucoma Father   . Hypertension Father   .  Breast cancer Maternal Aunt     No Known Allergies  No current outpatient medications on file prior to visit.   No current facility-administered medications on file prior to visit.     BP 112/70   Pulse 67   Temp 97.8 F (36.6 C) (Temporal)   Ht 5' 6.5" (1.689 m)   Wt 132 lb (59.9 kg)   SpO2 99%   BMI 20.99 kg/m    Objective:   Physical Exam  Constitutional: She is oriented to person, place, and time. She appears well-nourished.  HENT:  Right Ear: Tympanic membrane and ear canal normal.  Left Ear: Tympanic membrane and ear canal normal.  Mouth/Throat: Oropharynx is clear and moist.  Eyes: Pupils are equal, round, and reactive to light. EOM are normal.  Neck: Neck supple.  Cardiovascular: Normal rate and regular rhythm.  Respiratory: Effort normal and breath sounds normal.  GI: Soft. Bowel sounds are normal. There is no abdominal tenderness.  Musculoskeletal: Normal range of motion.  Neurological: She is alert and oriented to person, place, and time.  Skin: Skin is warm and dry.  Psychiatric: She has a normal mood and affect.           Assessment & Plan:

## 2018-11-21 NOTE — Patient Instructions (Signed)
Continue exercising. You should be getting 150 minutes of moderate intensity exercise weekly.  Continue to work on your diet, limit sweets/desserts near bedtime.  Ensure you are consuming 64 ounces of water daily.  You may take Melatonin as needed for sleep, do not exceed 10 mg in 24 hours.  It was a pleasure to see you today!   Preventive Care 43-60 Years Old, Female Preventive care refers to visits with your health care provider and lifestyle choices that can promote health and wellness. This includes:  A yearly physical exam. This may also be called an annual well check.  Regular dental visits and eye exams.  Immunizations.  Screening for certain conditions.  Healthy lifestyle choices, such as eating a healthy diet, getting regular exercise, not using drugs or products that contain nicotine and tobacco, and limiting alcohol use. What can I expect for my preventive care visit? Physical exam Your health care provider will check your:  Height and weight. This may be used to calculate body mass index (BMI), which tells if you are at a healthy weight.  Heart rate and blood pressure.  Skin for abnormal spots. Counseling Your health care provider may ask you questions about your:  Alcohol, tobacco, and drug use.  Emotional well-being.  Home and relationship well-being.  Sexual activity.  Eating habits.  Work and work Statistician.  Method of birth control.  Menstrual cycle.  Pregnancy history. What immunizations do I need?  Influenza (flu) vaccine  This is recommended every year. Tetanus, diphtheria, and pertussis (Tdap) vaccine  You may need a Td booster every 10 years. Varicella (chickenpox) vaccine  You may need this if you have not been vaccinated. Zoster (shingles) vaccine  You may need this after age 22. Measles, mumps, and rubella (MMR) vaccine  You may need at least one dose of MMR if you were born in 1957 or later. You may also need a second  dose. Pneumococcal conjugate (PCV13) vaccine  You may need this if you have certain conditions and were not previously vaccinated. Pneumococcal polysaccharide (PPSV23) vaccine  You may need one or two doses if you smoke cigarettes or if you have certain conditions. Meningococcal conjugate (MenACWY) vaccine  You may need this if you have certain conditions. Hepatitis A vaccine  You may need this if you have certain conditions or if you travel or work in places where you may be exposed to hepatitis A. Hepatitis B vaccine  You may need this if you have certain conditions or if you travel or work in places where you may be exposed to hepatitis B. Haemophilus influenzae type b (Hib) vaccine  You may need this if you have certain conditions. Human papillomavirus (HPV) vaccine  If recommended by your health care provider, you may need three doses over 6 months. You may receive vaccines as individual doses or as more than one vaccine together in one shot (combination vaccines). Talk with your health care provider about the risks and benefits of combination vaccines. What tests do I need? Blood tests  Lipid and cholesterol levels. These may be checked every 5 years, or more frequently if you are over 72 years old.  Hepatitis C test.  Hepatitis B test. Screening  Lung cancer screening. You may have this screening every year starting at age 40 if you have a 30-pack-year history of smoking and currently smoke or have quit within the past 15 years.  Colorectal cancer screening. All adults should have this screening starting at age 46 and  continuing until age 47. Your health care provider may recommend screening at age 55 if you are at increased risk. You will have tests every 1-10 years, depending on your results and the type of screening test.  Diabetes screening. This is done by checking your blood sugar (glucose) after you have not eaten for a while (fasting). You may have this done every  1-3 years.  Mammogram. This may be done every 1-2 years. Talk with your health care provider about when you should start having regular mammograms. This may depend on whether you have a family history of breast cancer.  BRCA-related cancer screening. This may be done if you have a family history of breast, ovarian, tubal, or peritoneal cancers.  Pelvic exam and Pap test. This may be done every 3 years starting at age 59. Starting at age 41, this may be done every 5 years if you have a Pap test in combination with an HPV test. Other tests  Sexually transmitted disease (STD) testing.  Bone density scan. This is done to screen for osteoporosis. You may have this scan if you are at high risk for osteoporosis. Follow these instructions at home: Eating and drinking  Eat a diet that includes fresh fruits and vegetables, whole grains, lean protein, and low-fat dairy.  Take vitamin and mineral supplements as recommended by your health care provider.  Do not drink alcohol if: ? Your health care provider tells you not to drink. ? You are pregnant, may be pregnant, or are planning to become pregnant.  If you drink alcohol: ? Limit how much you have to 0-1 drink a day. ? Be aware of how much alcohol is in your drink. In the [place] one drink equals one 12 oz bottle of beer (355 mL), one 5 oz glass of wine (148 mL), or one 1 oz glass of hard liquor (44 mL). Lifestyle  Take daily care of your teeth and gums.  Stay active. Exercise for at least 30 minutes on 5 or more days each week.  Do not use any products that contain nicotine or tobacco, such as cigarettes, e-cigarettes, and chewing tobacco. If you need help quitting, ask your health care provider.  If you are sexually active, practice safe sex. Use a condom or other form of birth control (contraception) in order to prevent pregnancy and STIs (sexually transmitted infections).  If told by your health care provider, take low-dose aspirin daily  starting at age 25. What's next?  Visit your health care provider once a year for a well check visit.  Ask your health care provider how often you should have your eyes and teeth checked.  Stay up to date on all vaccines. This information is not intended to replace advice given to you by your health care provider. Make sure you discuss any questions you have with your health care provider. Document Released: 02/07/2015 Document Revised: 09/22/2017 Document Reviewed: 09/22/2017 Elsevier Patient Education  2020 [employer].

## 2018-11-22 NOTE — Addendum Note (Signed)
Addended by: Jacqualin Combes on: 11/22/2018 07:44 AM   Modules accepted: Orders

## 2018-12-29 ENCOUNTER — Ambulatory Visit
Admission: RE | Admit: 2018-12-29 | Discharge: 2018-12-29 | Disposition: A | Discharge status: Home / Self Care | Payer: Commercial Managed Care - PPO | Source: Ambulatory Visit | Attending: Primary Care | Admitting: Primary Care

## 2018-12-29 DIAGNOSIS — Z1231 Encounter for screening mammogram for malignant neoplasm of breast: Secondary | ICD-10-CM | POA: Diagnosis not present

## 2019-02-22 NOTE — Telephone Encounter (Signed)
Rosaria Ferries, who do we use for ENT and Orthopedics, specifically knees? I wasn't sure. Thanks!

## 2019-02-23 NOTE — Telephone Encounter (Signed)
Thanks

## 2019-04-17 ENCOUNTER — Telehealth: Payer: Self-pay | Admitting: *Deleted

## 2019-04-17 NOTE — Telephone Encounter (Signed)
It may be the best to reschedule her vaccine to a time when she's feeling better.

## 2019-04-17 NOTE — Telephone Encounter (Signed)
Patient called stating that she is scheduled for the covid vaccine Thursday, Patient stated that she is having sinus issues. Patient stated that she has had sinus problems for years, but it seems to be a little worse now. Patient stated that she is having some sinus drainage off and on and some head congestion that comes and goes. Patient wants to know if Allie Bossier NP thinks that it would be best to hold off for a while to get the vaccine since this is going on with her sinuswa?  Patient denies a fever.

## 2019-04-18 NOTE — Telephone Encounter (Signed)
Patient advised. Patient states she is feeling fine now. Feels like its the weather change.

## 2019-04-19 ENCOUNTER — Ambulatory Visit: Payer: Commercial Managed Care - PPO | Attending: Internal Medicine

## 2019-04-19 DIAGNOSIS — Z23 Encounter for immunization: Secondary | ICD-10-CM

## 2019-04-19 NOTE — Progress Notes (Signed)
   Covid-19 Vaccination Clinic  Name:  Hannah Rivas    MRN: [id number] DOB: 1960-05-10  04/19/2019  [name] was observed post Covid-19 immunization for 15 minutes without incident. She was provided with Vaccine Information Sheet and instruction to access the V-Safe system.   [name] was instructed to call 911 with any severe reactions post vaccine: Marland Kitchen Difficulty breathing  . Swelling of face and throat  . A fast heartbeat  . A bad rash all over body  . Dizziness and weakness   Immunizations Administered    Name Date Dose VIS Date Route   Pfizer COVID-19 Vaccine 04/19/2019  1:08 PM 0.3 mL 01/05/2019 Intramuscular   Manufacturer: [place]   Lot: [id number]   [place]: [id number]

## 2019-04-30 ENCOUNTER — Ambulatory Visit (INDEPENDENT_AMBULATORY_CARE_PROVIDER_SITE_OTHER): Payer: Self-pay

## 2019-04-30 ENCOUNTER — Other Ambulatory Visit: Payer: Self-pay

## 2019-04-30 DIAGNOSIS — I781 Nevus, non-neoplastic: Secondary | ICD-10-CM

## 2019-04-30 DIAGNOSIS — L578 Other skin changes due to chronic exposure to nonionizing radiation: Secondary | ICD-10-CM

## 2019-04-30 DIAGNOSIS — Z872 Personal history of diseases of the skin and subcutaneous tissue: Secondary | ICD-10-CM

## 2019-05-15 ENCOUNTER — Ambulatory Visit: Payer: Commercial Managed Care - PPO | Attending: Internal Medicine

## 2019-05-15 DIAGNOSIS — Z23 Encounter for immunization: Secondary | ICD-10-CM

## 2019-05-15 NOTE — Progress Notes (Signed)
   Covid-19 Vaccination Clinic  Name:  Hannah Rivas    MRN: [id number] DOB: 1960-11-19  05/15/2019  [name] was observed post Covid-19 immunization for 15 minutes without incident. She was provided with Vaccine Information Sheet and instruction to access the V-Safe system.   [name] was instructed to call 911 with any severe reactions post vaccine: Marland Kitchen Difficulty breathing  . Swelling of face and throat  . A fast heartbeat  . A bad rash all over body  . Dizziness and weakness   Immunizations Administered    Name Date Dose VIS Date Route   Pfizer COVID-19 Vaccine 05/15/2019  8:02 AM 0.3 mL 03/21/2018 Intramuscular   Manufacturer: [place]   Lot: [id number]   [place]: [id number]

## 2019-06-04 ENCOUNTER — Ambulatory Visit (INDEPENDENT_AMBULATORY_CARE_PROVIDER_SITE_OTHER): Payer: Self-pay

## 2019-06-04 ENCOUNTER — Other Ambulatory Visit: Payer: Self-pay

## 2019-06-04 DIAGNOSIS — I781 Nevus, non-neoplastic: Secondary | ICD-10-CM

## 2019-06-04 DIAGNOSIS — L719 Rosacea, unspecified: Secondary | ICD-10-CM

## 2019-06-04 DIAGNOSIS — Z872 Personal history of diseases of the skin and subcutaneous tissue: Secondary | ICD-10-CM

## 2019-06-04 DIAGNOSIS — L738 Other specified follicular disorders: Secondary | ICD-10-CM

## 2019-09-17 ENCOUNTER — Other Ambulatory Visit: Payer: Self-pay | Admitting: Physician Assistant

## 2019-09-17 DIAGNOSIS — J019 Acute sinusitis, unspecified: Secondary | ICD-10-CM

## 2019-09-20 ENCOUNTER — Ambulatory Visit: Payer: Commercial Managed Care - PPO

## 2019-09-21 ENCOUNTER — Other Ambulatory Visit: Payer: Self-pay | Admitting: Primary Care

## 2019-09-21 DIAGNOSIS — R600 Localized edema: Secondary | ICD-10-CM

## 2019-09-26 ENCOUNTER — Ambulatory Visit
Admission: RE | Admit: 2019-09-26 | Discharge: 2019-09-26 | Disposition: A | Discharge status: Home / Self Care | Payer: Commercial Managed Care - PPO | Source: Ambulatory Visit | Attending: Physician Assistant | Admitting: Physician Assistant

## 2019-09-26 ENCOUNTER — Other Ambulatory Visit: Payer: Self-pay

## 2019-09-26 DIAGNOSIS — J019 Acute sinusitis, unspecified: Secondary | ICD-10-CM | POA: Insufficient documentation

## 2019-10-18 ENCOUNTER — Ambulatory Visit: Payer: Commercial Managed Care - PPO | Admitting: Primary Care

## 2019-10-18 ENCOUNTER — Other Ambulatory Visit: Payer: Self-pay

## 2019-10-18 ENCOUNTER — Encounter: Payer: Self-pay | Admitting: Primary Care

## 2019-10-18 VITALS — BP 110/62 | HR 93 | Temp 97.6°F | Ht 66.5 in | Wt 132.0 lb

## 2019-10-18 DIAGNOSIS — R300 Dysuria: Secondary | ICD-10-CM | POA: Insufficient documentation

## 2019-10-18 DIAGNOSIS — Z23 Encounter for immunization: Secondary | ICD-10-CM | POA: Diagnosis not present

## 2019-10-18 LAB — POC URINALSYSI DIPSTICK (AUTOMATED)
Bilirubin, UA: NEGATIVE
Glucose, UA: NEGATIVE
Ketones, UA: NEGATIVE
Nitrite, UA: NEGATIVE
Protein, UA: POSITIVE — AB
Spec Grav, UA: 1.015 (ref 1.010–1.025)
Urobilinogen, UA: 0.2 E.U./dL
pH, UA: 6 (ref 5.0–8.0)

## 2019-10-18 MED ORDER — SULFAMETHOXAZOLE-TRIMETHOPRIM 800-160 MG PO TABS: 1.0000 | ORAL_TABLET | Freq: Two times a day (BID) | ORAL | 0 refills | Status: DC

## 2019-10-18 NOTE — Progress Notes (Signed)
JYT

## 2019-10-18 NOTE — Progress Notes (Signed)
Subjective:    Patient ID: Hannah Rivas, female    DOB: 10-Nov-1960, 59 y.o.   MRN: [id number]  HPI  This visit occurred during the SARS-CoV-2 public health emergency.  Safety protocols were in place, including screening questions prior to the visit, additional usage of staff PPE, and extensive cleaning of exam room while observing appropriate contact time as indicated for disinfecting solutions.   [name] is a 59 year old female who presents today with a chief complaint of dysuria.  She also reports hematuria. Symptoms began about four days ago. She started to increase intake of water this week. She denies flank pain, vaginal discharge, vaginal itching. She's not taken anything OTC for symptoms.   Review of Systems  Constitutional: Negative for fever.  Gastrointestinal: Negative for abdominal pain.  Genitourinary: Positive for dysuria, hematuria and urgency. Negative for flank pain and pelvic pain.       Past Medical History:  Diagnosis Date  . Arthritis   . Basal cell carcinoma   . Breast cancer ([place]) 2005   Left  . Edema   . Frequent headaches   . Personal history of radiation therapy 2005   Left breast     Social History   Socioeconomic History  . Marital status: Married    Spouse name: Not on file  . Number of children: Not on file  . Years of education: Not on file  . Highest education level: Not on file  Occupational History  . Not on file  Tobacco Use  . Smoking status: Never Smoker  . Smokeless tobacco: Never Used  Vaping Use  . Vaping Use: Never used  Substance and Sexual Activity  . Alcohol use: Yes  . Drug use: Not on file  . Sexual activity: Not on file  Other Topics Concern  . Not on file  Social History Narrative  . Not on file   Social Determinants of Health   Financial Resource Strain:   . Difficulty of Paying Living Expenses: Not on file  Food Insecurity:   . Worried About Charity fundraiser in the Last Year: Not on file  . Ran  Out of Food in the Last Year: Not on file  Transportation Needs:   . Lack of Transportation (Medical): Not on file  . Lack of Transportation (Non-Medical): Not on file  Physical Activity:   . Days of Exercise per Week: Not on file  . Minutes of Exercise per Session: Not on file  Stress:   . Feeling of Stress : Not on file  Social Connections:   . Frequency of Communication with Friends and Family: Not on file  . Frequency of Social Gatherings with Friends and Family: Not on file  . Attends Religious Services: Not on file  . Active Member of Clubs or Organizations: Not on file  . Attends Archivist Meetings: Not on file  . Marital Status: Not on file  Intimate Partner Violence:   . Fear of Current or Ex-Partner: Not on file  . Emotionally Abused: Not on file  . Physically Abused: Not on file  . Sexually Abused: Not on file    Past Surgical History:  Procedure Laterality Date  . BASAL CELL CARCINOMA EXCISION  2012, 2015  . BREAST BIOPSY Left 2005   Positive  . BREAST BIOPSY Right 1998   neg  . BREAST LUMPECTOMY Left 2005  . FOOT SURGERY Left 2018  . PARTIAL HYSTERECTOMY  2006  . REDUCTION MAMMAPLASTY Right 2005  Family History  Problem Relation Age of Onset  . Heart attack Mother   . Stroke Father   . Glaucoma Father   . Hypertension Father   . Breast cancer Maternal Aunt     No Known Allergies  Current Outpatient Medications on File Prior to Visit  Medication Sig Dispense Refill  . hydrochlorothiazide (HYDRODIURIL) 25 MG tablet TAKE 1 TABLET DAILY 90 tablet 3   No current facility-administered medications on file prior to visit.    BP 110/62   Pulse 93   Temp 97.6 F (36.4 C) (Temporal)   Ht 5' 6.5" (1.689 m)   Wt 132 lb (59.9 kg)   SpO2 97%   BMI 20.99 kg/m    Objective:   Physical Exam Pulmonary:     Effort: Pulmonary effort is normal.  Abdominal:     General: Abdomen is flat.     Palpations: Abdomen is soft.     Tenderness: There  is no abdominal tenderness. There is no right CVA tenderness or left CVA tenderness.  Skin:    General: Skin is warm and dry.  Neurological:     Mental Status: She is alert.            Assessment & Plan:

## 2019-10-18 NOTE — Patient Instructions (Signed)
Start Bactrim DS (sulfamethoxazole/trimethoprim) tablets for urinary tract infection. Take 1 tablet by mouth twice daily for 3 days.  Continue to drink plenty of water.   It was a pleasure to see you today!

## 2019-10-18 NOTE — Assessment & Plan Note (Signed)
Acute symptoms x 4 days. Now with hematuria. No signs of renal stone or acute pyelonephritis.  UA today with 2+ leuks, 3+ blood. Culture sent.  Given symptoms coupled with UA results, will treat. Rx for Bactrim DS course sent to pharmacy.

## 2019-10-20 LAB — URINE CULTURE
MICRO NUMBER:: 10987818
SPECIMEN QUALITY:: ADEQUATE

## 2019-12-24 ENCOUNTER — Ambulatory Visit: Payer: Commercial Managed Care - PPO | Attending: Internal Medicine

## 2019-12-24 DIAGNOSIS — Z23 Encounter for immunization: Secondary | ICD-10-CM

## 2019-12-24 NOTE — Progress Notes (Signed)
   Covid-19 Vaccination Clinic  Name:  Hannah Rivas    MRN: [id number] DOB: May 07, 1960  12/24/2019  Ms. Halbleib was observed post Covid-19 immunization for 15 minutes without incident. She was provided with Vaccine Information Sheet and instruction to access the V-Safe system.   Ms. Rosten was instructed to call 911 with any severe reactions post vaccine: Marland Kitchen Difficulty breathing  . Swelling of face and throat  . A fast heartbeat  . A bad rash all over body  . Dizziness and weakness   Immunizations Administered    Name Date Dose VIS Date Route   Pfizer COVID-19 Vaccine 12/24/2019  1:43 PM 0.3 mL 11/14/2019 Intramuscular   Manufacturer: [place]   Lot: [id number]   [place]: [id number]

## 2020-01-30 ENCOUNTER — Other Ambulatory Visit: Payer: Self-pay

## 2020-01-30 DIAGNOSIS — Z1231 Encounter for screening mammogram for malignant neoplasm of breast: Secondary | ICD-10-CM

## 2020-04-04 ENCOUNTER — Ambulatory Visit
Admission: RE | Admit: 2020-04-04 | Discharge: 2020-04-04 | Disposition: A | Discharge status: Home / Self Care | Payer: Commercial Managed Care - PPO | Source: Ambulatory Visit | Attending: Primary Care | Admitting: Primary Care

## 2020-04-04 ENCOUNTER — Other Ambulatory Visit: Payer: Self-pay

## 2020-04-04 DIAGNOSIS — Z1231 Encounter for screening mammogram for malignant neoplasm of breast: Secondary | ICD-10-CM | POA: Diagnosis not present

## 2020-04-16 ENCOUNTER — Ambulatory Visit (INDEPENDENT_AMBULATORY_CARE_PROVIDER_SITE_OTHER): Payer: Commercial Managed Care - PPO | Admitting: Primary Care

## 2020-04-16 ENCOUNTER — Encounter: Payer: Self-pay | Admitting: Primary Care

## 2020-04-16 ENCOUNTER — Other Ambulatory Visit: Payer: Self-pay

## 2020-04-16 VITALS — BP 100/60 | HR 98 | Temp 97.7°F | Ht 66.5 in | Wt 128.0 lb

## 2020-04-16 DIAGNOSIS — R079 Chest pain, unspecified: Secondary | ICD-10-CM

## 2020-04-16 DIAGNOSIS — Z1211 Encounter for screening for malignant neoplasm of colon: Secondary | ICD-10-CM

## 2020-04-16 DIAGNOSIS — R600 Localized edema: Secondary | ICD-10-CM | POA: Diagnosis not present

## 2020-04-16 DIAGNOSIS — Z114 Encounter for screening for human immunodeficiency virus [HIV]: Secondary | ICD-10-CM | POA: Diagnosis not present

## 2020-04-16 DIAGNOSIS — Z0001 Encounter for general adult medical examination with abnormal findings: Secondary | ICD-10-CM | POA: Diagnosis not present

## 2020-04-16 DIAGNOSIS — E785 Hyperlipidemia, unspecified: Secondary | ICD-10-CM | POA: Diagnosis not present

## 2020-04-16 HISTORY — DX: Chest pain, unspecified: R07.9

## 2020-04-16 LAB — CBC
HCT: 41.6 % (ref 36.0–46.0)
Hemoglobin: 13.9 g/dL (ref 12.0–15.0)
MCHC: 33.4 g/dL (ref 30.0–36.0)
MCV: 83.2 fl (ref 78.0–100.0)
Platelets: 172 10*3/uL (ref 150.0–400.0)
RBC: 5 Mil/uL (ref 3.87–5.11)
RDW: 12.7 % (ref 11.5–15.5)
WBC: 4.2 10*3/uL (ref 4.0–10.5)

## 2020-04-16 LAB — COMPREHENSIVE METABOLIC PANEL
ALT: 15 U/L (ref 0–35)
AST: 24 U/L (ref 0–37)
Albumin: 4.5 g/dL (ref 3.5–5.2)
Alkaline Phosphatase: 57 U/L (ref 39–117)
BUN: 14 mg/dL (ref 6–23)
CO2: 32 mEq/L (ref 19–32)
Calcium: 9.9 mg/dL (ref 8.4–10.5)
Chloride: 99 mEq/L (ref 96–112)
Creatinine, Ser: 0.81 mg/dL (ref 0.40–1.20)
GFR: 78.37 mL/min (ref >60.00)
Glucose, Bld: 88 mg/dL (ref 70–99)
Potassium: 3.9 mEq/L (ref 3.5–5.1)
Sodium: 139 mEq/L (ref 135–145)
Total Bilirubin: 0.5 mg/dL (ref 0.2–1.2)
Total Protein: 6.6 g/dL (ref 6.0–8.3)

## 2020-04-16 LAB — LIPID PANEL
Cholesterol: 208 mg/dL — ABNORMAL HIGH (ref 0–200)
HDL: 83.6 mg/dL (ref >39.00)
LDL Cholesterol: 113 mg/dL — ABNORMAL HIGH (ref 0–99)
NonHDL: 124.61
Total CHOL/HDL Ratio: 2
Triglycerides: 59 mg/dL (ref 0.0–149.0)
VLDL: 11.8 mg/dL (ref 0.0–40.0)

## 2020-04-16 LAB — TSH: TSH: 1.6 u[IU]/mL (ref 0.35–4.50)

## 2020-04-16 NOTE — Assessment & Plan Note (Signed)
Immunizations UTD. Colonoscopy due, referral placed to GI. Mammogram UTD.  Discussed the importance of a healthy diet and regular exercise in order for weight loss, and to reduce the risk of any potential medical problems.  Exam today stable. Labs pending.

## 2020-04-16 NOTE — Assessment & Plan Note (Signed)
Repeat lipids pending.  Commended her on dietary changes.

## 2020-04-16 NOTE — Assessment & Plan Note (Addendum)
Two isolated episodes occurring with little exertion, resolving spontaneously.   Differentials include GERD vs CAD. Given family history will need to rule out CAD.  ECG today with NSR with rate of 65, LVH, left atrial enlargement? No image to compare previously but there is mention of inferior ST depression which is also mentioned today. Reviewed ECG with cardiology, not an urgent ECG but does warrant evaluation.   Referral placed to cardiology for evaluation.

## 2020-04-16 NOTE — Assessment & Plan Note (Signed)
Doing well on HCTZ 25 mg, continue same. CMP pending.

## 2020-04-16 NOTE — Progress Notes (Signed)
Subjective:    Patient ID: Hannah Rivas, female    DOB: 08-28-1960, 60 y.o.   MRN: [id number]  HPI  [name] is a very pleasant 60 y.o. female who presents today for complete physical.  She would also like to discuss two episodes of esophageal burning/"heartburn", chest pain. She developed a sudden onset of substernal burning in January 2022, "not feeling" well while standing in the grocery store, symptoms lasted for 15 minutes and then abated spontaneously. This occurred again in mid February 2022 while bending forward in the grocery store which lasted for about 5 minutes with spontaneous resolve.  She denies any other symptoms during the episodes. She exercises regularly at home and has no chest pain, SOB, nausea, sweats.   She has a FH of myocardial infarction in her mother who was in her early 30's.   Immunizations: -Tetanus: 2020 -Influenza: Completed this season  -Covid-19: Completed 3 vaccines -Shingles: Zostavax in 2017  Diet: She endorses an improved diet  Exercise: She is exercising at home  Eye exam: Completed in 2022 Dental exam: Completes semi-annually   Pap Smear: Partial hysterectomy Mammogram: March 2022 Colonoscopy: Due in 2021  BP Readings from Last 3 Encounters:  04/16/20 100/60  10/18/19 110/62  11/21/18 112/70       Review of Systems  Constitutional: Negative for unexpected weight change.  HENT: Negative for rhinorrhea.   Eyes: Negative for visual disturbance.  Respiratory: Negative for cough and shortness of breath.   Cardiovascular: Positive for chest pain.       See HPI  Gastrointestinal: Negative for constipation and diarrhea.  Genitourinary: Negative for difficulty urinating.  Musculoskeletal: Positive for arthralgias.  Skin: Negative for rash.  Allergic/Immunologic: Negative for environmental allergies.  Neurological: Negative for dizziness, numbness and headaches.  Psychiatric/Behavioral: The patient is not nervous/anxious.           Past Medical History:  Diagnosis Date  . Arthritis   . Basal cell carcinoma   . Breast cancer ([place]) 2005   Left  . Edema   . Frequent headaches   . Personal history of radiation therapy 2005   Left breast    Social History   Socioeconomic History  . Marital status: Married    Spouse name: Not on file  . Number of children: Not on file  . Years of education: Not on file  . Highest education level: Not on file  Occupational History  . Not on file  Tobacco Use  . Smoking status: Never Smoker  . Smokeless tobacco: Never Used  Vaping Use  . Vaping Use: Never used  Substance and Sexual Activity  . Alcohol use: Yes  . Drug use: Not on file  . Sexual activity: Not on file  Other Topics Concern  . Not on file  Social History Narrative  . Not on file   Social Determinants of Health   Financial Resource Strain: Not on file  Food Insecurity: Not on file  Transportation Needs: Not on file  Physical Activity: Not on file  Stress: Not on file  Social Connections: Not on file  Intimate Partner Violence: Not on file    Past Surgical History:  Procedure Laterality Date  . BASAL CELL CARCINOMA EXCISION  2012, 2015  . BREAST BIOPSY Left 2005   Positive  . BREAST BIOPSY Right 1998   neg  . BREAST LUMPECTOMY Left 2005  . FOOT SURGERY Left 2018  . PARTIAL HYSTERECTOMY  2006  . REDUCTION MAMMAPLASTY Right 2005  Family History  Problem Relation Age of Onset  . Heart attack Mother   . Stroke Father   . Glaucoma Father   . Hypertension Father   . Breast cancer Maternal Aunt     No Known Allergies  Current Outpatient Medications on File Prior to Visit  Medication Sig Dispense Refill  . hydrochlorothiazide (HYDRODIURIL) 25 MG tablet TAKE 1 TABLET DAILY 90 tablet 3  . Multiple Vitamin (MULTIVITAMIN) tablet Take 1 tablet by mouth daily.     No current facility-administered medications on file prior to visit.    BP 100/60   Pulse 98   Temp 97.7 F  (36.5 C) (Temporal)   Ht 5' 6.5" (1.689 m)   Wt 128 lb (58.1 kg)   SpO2 98%   BMI 20.35 kg/m  Objective:   Physical Exam HENT:     Right Ear: Tympanic membrane and ear canal normal.     Left Ear: Tympanic membrane and ear canal normal.     Nose: Nose normal.  Eyes:     Conjunctiva/sclera: Conjunctivae normal.     Pupils: Pupils are equal, round, and reactive to light.  Neck:     Thyroid: No thyromegaly.  Cardiovascular:     Rate and Rhythm: Normal rate and regular rhythm.     Heart sounds: No murmur heard.   Pulmonary:     Effort: Pulmonary effort is normal.     Breath sounds: Normal breath sounds. No rales.  Abdominal:     General: Bowel sounds are normal.     Palpations: Abdomen is soft.     Tenderness: There is no abdominal tenderness.  Musculoskeletal:        General: Normal range of motion.     Cervical back: Neck supple.  Lymphadenopathy:     Cervical: No cervical adenopathy.  Skin:    General: Skin is warm and dry.     Findings: No rash.  Neurological:     Mental Status: She is alert and oriented to person, place, and time.     Cranial Nerves: No cranial nerve deficit.     Deep Tendon Reflexes: Reflexes are normal and symmetric.  Psychiatric:        Mood and Affect: Mood normal.           Assessment & Plan:      This visit occurred during the SARS-CoV-2 public health emergency.  Safety protocols were in place, including screening questions prior to the visit, additional usage of staff PPE, and extensive cleaning of exam room while observing appropriate contact time as indicated for disinfecting solutions.

## 2020-04-16 NOTE — Patient Instructions (Signed)
Stop by the lab prior to leaving today. I will notify you of your results once received.   You will be contacted regarding your referral to Cardiology and to GI for the colonoscopy.  Please let us know if you have not been contacted within two weeks.   It's important to improve your diet by reducing consumption of fast food, fried food, processed snack foods, sugary drinks. Increase consumption of fresh vegetables and fruits, whole grains, water.  Ensure you are drinking 64 ounces of water daily.  It was a pleasure to see you today!   Preventive Care 89-62 Years Old, Female Preventive care refers to lifestyle choices and visits with your health care provider that can promote health and wellness. This includes:  A yearly physical exam. This is also called an annual wellness visit.  Regular dental and eye exams.  Immunizations.  Screening for certain conditions.  Healthy lifestyle choices, such as: ? Eating a healthy diet. ? Getting regular exercise. ? Not using drugs or products that contain nicotine and tobacco. ? Limiting alcohol use. What can I expect for my preventive care visit? Physical exam Your health care provider will check your:  Height and weight. These may be used to calculate your BMI (body mass index). BMI is a measurement that tells if you are at a healthy weight.  Heart rate and blood pressure.  Body temperature.  Skin for abnormal spots. Counseling Your health care provider may ask you questions about your:  Past medical problems.  Family's medical history.  Alcohol, tobacco, and drug use.  Emotional well-being.  Home life and relationship well-being.  Sexual activity.  Diet, exercise, and sleep habits.  Work and work Statistician.  Access to firearms.  Method of birth control.  Menstrual cycle.  Pregnancy history. What immunizations do I need? Vaccines are usually given at various ages, according to a schedule. Your health care  provider will recommend vaccines for you based on your age, medical history, and lifestyle or other factors, such as travel or where you work.   What tests do I need? Blood tests  Lipid and cholesterol levels. These may be checked every 5 years, or more often if you are over 39 years old.  Hepatitis C test.  Hepatitis B test. Screening  Lung cancer screening. You may have this screening every year starting at age 52 if you have a 30-pack-year history of smoking and currently smoke or have quit within the past 15 years.  Colorectal cancer screening. ? All adults should have this screening starting at age 49 and continuing until age 69. ? Your health care provider may recommend screening at age 82 if you are at increased risk. ? You will have tests every 1-10 years, depending on your results and the type of screening test.  Diabetes screening. ? This is done by checking your blood sugar (glucose) after you have not eaten for a while (fasting). ? You may have this done every 1-3 years.  Mammogram. ? This may be done every 1-2 years. ? Talk with your health care provider about when you should start having regular mammograms. This may depend on whether you have a family history of breast cancer.  BRCA-related cancer screening. This may be done if you have a family history of breast, ovarian, tubal, or peritoneal cancers.  Pelvic exam and Pap test. ? This may be done every 3 years starting at age 12. ? Starting at age 76, this may be done every 5 years if  you have a Pap test in combination with an HPV test. Other tests  STD (sexually transmitted disease) testing, if you are at risk.  Bone density scan. This is done to screen for osteoporosis. You may have this scan if you are at high risk for osteoporosis. Talk with your health care provider about your test results, treatment options, and if necessary, the need for more tests. Follow these instructions at home: Eating and  drinking  Eat a diet that includes fresh fruits and vegetables, whole grains, lean protein, and low-fat dairy products.  Take vitamin and mineral supplements as recommended by your health care provider.  Do not drink alcohol if: ? Your health care provider tells you not to drink. ? You are pregnant, may be pregnant, or are planning to become pregnant.  If you drink alcohol: ? Limit how much you have to 0-1 drink a day. ? Be aware of how much alcohol is in your drink. In the [place] one drink equals one 12 oz bottle of beer (355 mL), one 5 oz glass of wine (148 mL), or one 1 oz glass of hard liquor (44 mL).   Lifestyle  Take daily care of your teeth and gums. Brush your teeth every morning and night with fluoride toothpaste. Floss one time each day.  Stay active. Exercise for at least 30 minutes 5 or more days each week.  Do not use any products that contain nicotine or tobacco, such as cigarettes, e-cigarettes, and chewing tobacco. If you need help quitting, ask your health care provider.  Do not use drugs.  If you are sexually active, practice safe sex. Use a condom or other form of protection to prevent STIs (sexually transmitted infections).  If you do not wish to become pregnant, use a form of birth control. If you plan to become pregnant, see your health care provider for a prepregnancy visit.  If told by your health care provider, take low-dose aspirin daily starting at age 28.  Find healthy ways to cope with stress, such as: ? Meditation, yoga, or listening to music. ? Journaling. ? Talking to a trusted person. ? Spending time with friends and family. Safety  Always wear your seat belt while driving or riding in a vehicle.  Do not drive: ? If you have been drinking alcohol. Do not ride with someone who has been drinking. ? When you are tired or distracted. ? While texting.  Wear a helmet and other protective equipment during sports activities.  If you have firearms  in your house, make sure you follow all gun safety procedures. What's next?  Visit your health care provider once a year for an annual wellness visit.  Ask your health care provider how often you should have your eyes and teeth checked.  Stay up to date on all vaccines. This information is not intended to replace advice given to you by your health care provider. Make sure you discuss any questions you have with your health care provider. Document Revised: 10/16/2019 Document Reviewed: 09/22/2017 Elsevier Patient Education  2021 [employer].

## 2020-04-17 LAB — HIV ANTIBODY (ROUTINE TESTING W REFLEX): HIV 1&2 Ab, 4th Generation: NONREACTIVE

## 2020-04-22 ENCOUNTER — Encounter: Payer: Self-pay | Admitting: Gastroenterology

## 2020-05-12 ENCOUNTER — Ambulatory Visit: Payer: Commercial Managed Care - PPO | Admitting: Cardiology

## 2020-05-26 ENCOUNTER — Encounter: Payer: Self-pay | Admitting: Cardiovascular Disease

## 2020-05-26 ENCOUNTER — Other Ambulatory Visit: Payer: Self-pay

## 2020-05-26 ENCOUNTER — Ambulatory Visit: Payer: Commercial Managed Care - PPO | Admitting: Cardiovascular Disease

## 2020-05-26 VITALS — BP 100/60 | HR 66 | Ht 66.5 in | Wt 129.5 lb

## 2020-05-26 DIAGNOSIS — E782 Mixed hyperlipidemia: Secondary | ICD-10-CM | POA: Diagnosis not present

## 2020-05-26 DIAGNOSIS — Z823 Family history of stroke: Secondary | ICD-10-CM | POA: Diagnosis not present

## 2020-05-26 DIAGNOSIS — R600 Localized edema: Secondary | ICD-10-CM | POA: Diagnosis not present

## 2020-05-26 DIAGNOSIS — R079 Chest pain, unspecified: Secondary | ICD-10-CM | POA: Diagnosis not present

## 2020-05-26 DIAGNOSIS — Z136 Encounter for screening for cardiovascular disorders: Secondary | ICD-10-CM

## 2020-05-26 NOTE — Progress Notes (Signed)
Cardiology Office Note  Date:  05/26/2020   ID:  [name], DOB 08-04-1960, MRN [id number]  PCP:  Pleas Koch, NP   Chief Complaint  Patient presents with  . New Patient (Initial Visit)    Ref by Alma Friendly, NP for chest pain. Medications reviewed by the patient verbally. Patient c/o LE edema & fluttering in chest at times.     HPI:  Hannah Rivas is a 60 year old woman with past medical history of Hypertension Who presents by referral from Alma Friendly for consultation of her two isolated episodes of substernal chest pressure with spontaneous resolve.  FH of myocardial infarction in mother.  Discussed recent events with her in detail Tachycardia last fall 2021, woke from sleep, fast heart rate Lasting 20 min, Broke without intervention, no prior episodes, none since then  Primary care notes reviewed, 2 episodes esophageal burning/heartburn/chest pain Sudden onset substernal burning January 2022, "heartburn" was not feeling well while standing in a grocery store lasting 15 minutes Mild increase in heart beat but not like it was fall 2021  Recurred again mid February 2022  when bending forward in a grocery store lasting 5 minutes Indigestion, no fluttering No change in heart rate  Feels well otherwise, active with no anginal symptoms  EKG personally reviewed by myself on todays visit Shows normal sinus rhythm with rate 66 bpm no significant ST or T wave changes  Family history Mother, MI in her early 22s CVAs in numerous family members   PMH:   has a past medical history of Arthritis, Basal cell carcinoma, Breast cancer ([place]) (2005), Edema, Frequent headaches, and Personal history of radiation therapy (2005).  PSH:    Past Surgical History:  Procedure Laterality Date  . BASAL CELL CARCINOMA EXCISION  2012, 2015  . BREAST BIOPSY Left 2005   Positive  . BREAST BIOPSY Right 1998   neg  . BREAST LUMPECTOMY Left 2005  . FOOT SURGERY Left 2018  .  PARTIAL HYSTERECTOMY  2006  . REDUCTION MAMMAPLASTY Right 2005    Current Outpatient Medications  Medication Sig Dispense Refill  . hydrochlorothiazide (HYDRODIURIL) 25 MG tablet TAKE 1 TABLET DAILY 90 tablet 3  . Multiple Vitamin (MULTIVITAMIN) tablet Take 1 tablet by mouth daily.     No current facility-administered medications for this visit.     Allergies:   Patient has no known allergies.   Social History:  The patient  reports that she has never smoked. She has never used smokeless tobacco. She reports current alcohol use. She reports that she does not use drugs.   Family History:   family history includes Breast cancer in her maternal aunt; Glaucoma in her father; Heart attack in her mother; Hypertension in her father; Stroke in her father.    Review of Systems: Review of Systems  Constitutional: Negative.   HENT: Negative.   Respiratory: Negative.   Cardiovascular: Negative.   Gastrointestinal: Negative.   Musculoskeletal: Negative.   Neurological: Negative.   Psychiatric/Behavioral: Negative.   All other systems reviewed and are negative.    PHYSICAL EXAM: VS:  BP 100/60 (BP Location: Right Arm, Patient Position: Sitting, Cuff Size: Normal)   Pulse 66   Ht 5' 6.5" (1.689 m)   Wt 129 lb 8 oz (58.7 kg)   SpO2 98%   BMI 20.59 kg/m  , BMI Body mass index is 20.59 kg/m. GEN: Well nourished, well developed, in no acute distress HEENT: normal Neck: no JVD, carotid bruits, or masses Cardiac: RRR; no  murmurs, rubs, or gallops,no edema  Respiratory:  clear to auscultation bilaterally, normal work of breathing GI: soft, nontender, nondistended, + BS MS: no deformity or atrophy Skin: warm and dry, no rash Neuro:  Strength and sensation are intact Psych: euthymic mood, full affect   Recent Labs: 04/16/2020: ALT 15; BUN 14; Creatinine, Ser 0.81; Hemoglobin 13.9; Platelets 172.0; Potassium 3.9; Sodium 139; TSH 1.60    Lipid Panel Lab Results  Component Value Date    CHOL 208 (H) 04/16/2020   HDL 83.60 04/16/2020   LDLCALC 113 (H) 04/16/2020   TRIG 59.0 04/16/2020      Wt Readings from Last 3 Encounters:  05/26/20 129 lb 8 oz (58.7 kg)  04/16/20 128 lb (58.1 kg)  10/18/19 132 lb (59.9 kg)      ASSESSMENT AND PLAN:  Problem List Items Addressed This Visit      Cardiology Problems   Hyperlipidemia     Other   Extremity edema   Chest pain - Primary     Chest pain Atypical in nature, concerning for GI etiology No anginal symptoms on a regular basis, especially with exertion Recommended screening study with CT coronary calcium scoring Cholesterol is elevated, will monitor calcium score results to help guide management  History of strokes Work-up discussed, carotid ultrasound ordered for further evaluation and to help guide management of hyperlipidemia  GERD For recurrent symptoms recommend she start omeprazole on daily basis  Paroxysmal tachycardia Concerning for 20-minute episode of SVT in the fall 2021, no recurrent episodes Unable to exclude other arrhythmia ZIO monitor discussed Also discussed a cardio mobile for evaluation at home for any further episodes Also discussed monitoring system such as apple watch    Total encounter time more than 60 minutes  Greater than 50% was spent in counseling and coordination of care with the patient  Patient seen in consultation for Alma Friendly will be referred back to her office for ongoing care of the issues detailed above  Signed, Esmond Plants, M.D., Ph.D. [place], [place]

## 2020-05-26 NOTE — Patient Instructions (Addendum)
Look into [employer] for rhythm tracking Read about SVT, supraventricular tachycardia  For more "heartburn" episode: try omeprazole  Can buy over the counter  Medication Instructions:  No changes  If you need a refill on your cardiac medications before your next appointment, please call your pharmacy.   Lab work: No new labs needed  Testing/Procedures: CT coronary calcium score  $99 out of pocket expense  at our [facility] in [place]  This procedure uses special x-ray equipment to produce pictures of the coronary arteries to determine if they are blocked or narrowed by the buildup of plaque - an indicator for atherosclerosis or coronary artery disease (CAD).  Please call [phone] to schedule at your earliest convince   [facility] [address] [place], [place] [address]  Carotid ultrasound   [place] office   Follow-Up:  . You will need a follow up appointment as needed  Please call the office when you would like to schedule an appt  . Providers on your designated Care Team:   . Murray Hodgkins, NP . Christell Faith, PA-C . Marrianne Mood, PA-C   COVID-19 Vaccine Information can be found at: [email or web address].uk For questions related to vaccine distribution or appointments, please email [email or web address] or call [phone].

## 2020-05-27 ENCOUNTER — Ambulatory Visit
Admission: RE | Admit: 2020-05-27 | Discharge: 2020-05-27 | Disposition: A | Discharge status: Home / Self Care | Payer: Commercial Managed Care - PPO | Source: Ambulatory Visit | Attending: Cardiovascular Disease | Admitting: Cardiovascular Disease

## 2020-05-27 DIAGNOSIS — Z823 Family history of stroke: Secondary | ICD-10-CM | POA: Insufficient documentation

## 2020-05-27 DIAGNOSIS — E782 Mixed hyperlipidemia: Secondary | ICD-10-CM

## 2020-05-29 ENCOUNTER — Telehealth: Payer: Self-pay

## 2020-05-29 NOTE — Telephone Encounter (Signed)
Left detail message on VM of pt's recent results of CT Calcium Score, okay by DPR, Dr. Rockey Situ advised   "CT coronary calcium score  Score of 0  Excellent finding, indicating no calcified coronary atherosclerotic plaque noted  No other significant findings on the scan were picked up"  At this time, no further recommendations or medications changes, advised to call office for any concerns or questions, otherwise will see at next visit. Results are also uploaded to pt's MyChart.

## 2020-06-19 ENCOUNTER — Ambulatory Visit (AMBULATORY_SURGERY_CENTER): Payer: Commercial Managed Care - PPO

## 2020-06-19 ENCOUNTER — Ambulatory Visit: Payer: Commercial Managed Care - PPO

## 2020-06-19 ENCOUNTER — Other Ambulatory Visit: Payer: Self-pay

## 2020-06-19 VITALS — Ht 66.5 in | Wt 128.0 lb

## 2020-06-19 DIAGNOSIS — Z1211 Encounter for screening for malignant neoplasm of colon: Secondary | ICD-10-CM

## 2020-06-19 DIAGNOSIS — Z136 Encounter for screening for cardiovascular disorders: Secondary | ICD-10-CM

## 2020-06-19 MED ORDER — GOLYTELY 236 G PO SOLR: 4000.0000 mL | ORAL | 0 refills | Status: DC

## 2020-06-19 NOTE — Progress Notes (Signed)
Pre visit completed via phone call;  Patient verified name, DOB, and address; No egg or soy allergy known to patient  No issues with past sedation with any surgeries or procedures other than ======PONV Patient denies ever being told they had issues or difficulty with intubation  No FH of Malignant Hyperthermia No diet pills per patient No home 02 use per patient  No blood thinners per patient  Pt denies issues with constipation  No A fib or A flutter  EMMI video via MyChart  COVID 19 guidelines implemented in PV today with Pt and RN  Pt is fully vaccinated for Covid x 2 + booster; NO PA's for preps discussed with pt in PV today  Discussed with pt there will be an out-of-pocket cost for prep and that varies from $0 to 70 dollars  Due to the COVID-19 pandemic we are asking patients to follow certain guidelines.  Pt aware of COVID protocols and [facility] guidelines

## 2020-06-25 ENCOUNTER — Telehealth: Payer: Self-pay

## 2020-06-25 NOTE — Telephone Encounter (Signed)
Able to reach pt regarding her recent carotid US, Dr. Rockey Situ had a chance to review her results and advised   "Carotid ultrasound,  No significant disease noted"  Hannah Rivas is thankful for the phone call and good report, all questions and concerns were address and no additional concerns at this time, will call back for anything further.

## 2020-07-04 ENCOUNTER — Other Ambulatory Visit: Payer: Self-pay

## 2020-07-04 ENCOUNTER — Ambulatory Visit (AMBULATORY_SURGERY_CENTER): Payer: Commercial Managed Care - PPO | Admitting: Gastroenterology

## 2020-07-04 ENCOUNTER — Encounter: Payer: Self-pay | Admitting: Gastroenterology

## 2020-07-04 VITALS — BP 99/62 | HR 59 | Temp 97.3°F | Resp 28 | Ht 66.5 in | Wt 128.0 lb

## 2020-07-04 DIAGNOSIS — Z1211 Encounter for screening for malignant neoplasm of colon: Secondary | ICD-10-CM

## 2020-07-04 DIAGNOSIS — D123 Benign neoplasm of transverse colon: Secondary | ICD-10-CM | POA: Diagnosis not present

## 2020-07-04 MED ORDER — SODIUM CHLORIDE 0.9 % IV SOLN: 500.0000 mL | Freq: Once | INTRAVENOUS | Status: DC

## 2020-07-04 NOTE — Progress Notes (Signed)
Called to room to assist during endoscopic procedure.  Patient ID and intended procedure confirmed with present staff. Received instructions for my participation in the procedure from the performing physician.  

## 2020-07-04 NOTE — Patient Instructions (Signed)
YOU HAD AN ENDOSCOPIC PROCEDURE TODAY AT [facility]:   Refer to the procedure report that was given to you for any specific questions about what was found during the examination.  If the procedure report does not answer your questions, please call your gastroenterologist to clarify.  If you requested that your care partner not be given the details of your procedure findings, then the procedure report has been included in a sealed envelope for you to review at your convenience later.  Read all of the handouts given to you by your recovery room nurse.  You may resume all of your previous medications.  YOU SHOULD EXPECT: Some feelings of bloating in the abdomen. Passage of more gas than usual.  Walking can help get rid of the air that was put into your GI tract during the procedure and reduce the bloating. If you had a lower endoscopy (such as a colonoscopy or flexible sigmoidoscopy) you may notice spotting of blood in your stool or on the toilet paper. If you underwent a bowel prep for your procedure, you may not have a normal bowel movement for a few days.  Please Note:  You might notice some irritation and congestion in your nose or some drainage.  This is from the oxygen used during your procedure.  There is no need for concern and it should clear up in a day or so.  SYMPTOMS TO REPORT IMMEDIATELY:  Following lower endoscopy (colonoscopy or flexible sigmoidoscopy):  Excessive amounts of blood in the stool  Significant tenderness or worsening of abdominal pains  Swelling of the abdomen that is new, acute  Fever of 100F or higher   For urgent or emergent issues, a gastroenterologist can be reached at any hour by calling [phone]. Do not use MyChart messaging for urgent concerns.    DIET:  We do recommend a small meal at first, but then you may proceed to your regular diet.  Drink plenty of fluids but you should avoid alcoholic beverages for 24 hours.  ACTIVITY:  You  should plan to take it easy for the rest of today and you should NOT DRIVE or use heavy machinery until tomorrow (because of the sedation medicines used during the test).    FOLLOW UP: Our staff will call the number listed on your records 48-72 hours following your procedure to check on you and address any questions or concerns that you may have regarding the information given to you following your procedure. If we do not reach you, we will leave a message.  We will attempt to reach you two times.  During this call, we will ask if you have developed any symptoms of COVID 19. If you develop any symptoms (ie: fever, flu-like symptoms, shortness of breath, cough etc.) before then, please call [phone].  If you test positive for Covid 19 in the 2 weeks post procedure, please call and report this information to [place].    If any biopsies were taken you will be contacted by phone or by letter within the next 1-3 weeks.  Please call us at [phone] if you have not heard about the biopsies in 3 weeks.    SIGNATURES/CONFIDENTIALITY: You and/or your care partner have signed paperwork which will be entered into your electronic medical record.  These signatures attest to the fact that that the information above on your After Visit Summary has been reviewed and is understood.  Full responsibility of the confidentiality of this discharge information lies with  you and/or your care-partner.

## 2020-07-04 NOTE — Progress Notes (Signed)
Report to PACU, RN, vss, BBS= Clear.  

## 2020-07-04 NOTE — Op Note (Signed)
[facility] Patient Name: Hannah Rivas Procedure Date: 07/04/2020 8:30 AM MRN: [id number] Endoscopist: [place]. Loletha Carrow , MD Age: 60 Referring MD:  Date of Birth: 07/16/1960 Gender: Female Account #: [id number] Procedure:                Colonoscopy Indications:              Screening for colorectal malignant neoplasm                            (patient reported no polyps on colonoscopy in CT 10                            yrs prior) Medicines:                Monitored Anesthesia Care Procedure:                Pre-Anesthesia Assessment:                           - Prior to the procedure, a History and Physical                            was performed, and patient medications and                            allergies were reviewed. The patient's tolerance of                            previous anesthesia was also reviewed. The risks                            and benefits of the procedure and the sedation                            options and risks were discussed with the patient.                            All questions were answered, and informed consent                            was obtained. Prior Anticoagulants: The patient has                            taken no previous anticoagulant or antiplatelet                            agents. ASA Grade Assessment: II - A patient with                            mild systemic disease. After reviewing the risks                            and benefits, the patient was deemed in  satisfactory condition to undergo the procedure.                           After obtaining informed consent, the colonoscope                            was passed under direct vision. Throughout the                            procedure, the patient's blood pressure, pulse, and                            oxygen saturations were monitored continuously. The                            Olympus PFC-[id number] (#[id number]) Colonoscope was                             introduced through the anus and advanced to the the                            cecum, identified by appendiceal orifice and                            ileocecal valve. The patient tolerated the                            procedure well. The quality of the bowel                            preparation was good. The ileocecal valve,                            appendiceal orifice, and rectum were photographed.                            The bowel preparation used was GoLYTELY. The                            colonoscopy was somewhat difficult due to a                            redundant colon. Successful completion of the                            procedure was aided by using manual pressure (use                            adult scope for next exam). Scope In: 8:35:26 AM Scope Out: 8:55:38 AM Scope Withdrawal Time: 0 hours 15 minutes 47 seconds  Total Procedure Duration: 0 hours 20 minutes 12 seconds  Findings:                 The perianal and digital rectal examinations were  normal.                           Two semi-sessile polyps were found in the                            transverse colon. The polyps were diminutive in                            size. These polyps were removed with a cold snare.                            Resection and retrieval were complete.                           The exam was otherwise without abnormality on                            direct and retroflexion views. Complications:            No immediate complications. Estimated Blood Loss:     Estimated blood loss was minimal. Impression:               - Two diminutive polyps in the transverse colon,                            removed with a cold snare. Resected and retrieved.                           - The examination was otherwise normal on direct                            and retroflexion views. Recommendation:           - Patient has a contact number  available for                            emergencies. The signs and symptoms of potential                            delayed complications were discussed with the                            patient. Return to normal activities tomorrow.                            Written discharge instructions were provided to the                            patient.                           - Resume previous diet.                           - Continue present medications.                           -  Await pathology results.                           - Repeat colonoscopy is recommended for                            surveillance. The colonoscopy date will be                            determined after pathology results from today's                            exam become available for review. Kely Dohn L. Loletha Carrow, MD 07/04/2020 8:59:30 AM This report has been signed electronically.

## 2020-07-08 ENCOUNTER — Telehealth: Payer: Self-pay

## 2020-07-08 NOTE — Telephone Encounter (Signed)
  Follow up Call-  Call back number 07/04/2020  Post procedure Call Back phone  # [phone]  Permission to leave phone message Yes  Some recent data might be hidden     Patient questions:  Do you have a fever, pain , or abdominal swelling? No. Pain Score  0 *  Have you tolerated food without any problems? Yes.    Have you been able to return to your normal activities? Yes.    Do you have any questions about your discharge instructions: Diet   No. Medications  No. Follow up visit  No.  Do you have questions or concerns about your Care? No.  Actions: * If pain score is 4 or above: No action needed, pain <4.  Have you developed a fever since your procedure? no  2.   Have you had an respiratory symptoms (SOB or cough) since your procedure? no  3.   Have you tested positive for COVID 19 since your procedure no  4.   Have you had any family members/close contacts diagnosed with the COVID 19 since your procedure?  no   If yes to any of these questions please route to Joylene John, RN and Joella Prince, RN

## 2020-07-09 ENCOUNTER — Encounter: Payer: Self-pay | Admitting: Gastroenterology

## 2020-07-10 ENCOUNTER — Other Ambulatory Visit: Payer: Self-pay | Admitting: Primary Care

## 2020-07-10 DIAGNOSIS — R600 Localized edema: Secondary | ICD-10-CM

## 2020-10-24 ENCOUNTER — Ambulatory Visit: Payer: Commercial Managed Care - PPO

## 2020-10-28 NOTE — Telephone Encounter (Signed)
Completed form and placed in Joellen's in box. 

## 2020-10-28 NOTE — Telephone Encounter (Signed)
Placed in your folder for review. 

## 2020-10-29 NOTE — Telephone Encounter (Signed)
Form faxed and holding to send to scan

## 2020-12-26 ENCOUNTER — Other Ambulatory Visit: Payer: Self-pay

## 2020-12-26 ENCOUNTER — Telehealth (INDEPENDENT_AMBULATORY_CARE_PROVIDER_SITE_OTHER): Payer: Commercial Managed Care - PPO | Admitting: Primary Care

## 2020-12-26 ENCOUNTER — Encounter: Payer: Self-pay | Admitting: Primary Care

## 2020-12-26 DIAGNOSIS — R0981 Nasal congestion: Secondary | ICD-10-CM | POA: Insufficient documentation

## 2020-12-26 NOTE — Patient Instructions (Signed)
  Stop the Flonase as it may be contributing to nosebleeds.  Continue the saline nasal spray and Nettie pot rinses.  Please update me if no improvement as discussed.

## 2020-12-26 NOTE — Assessment & Plan Note (Signed)
Secondary to environmental changes from traveling to and from higher altitudes.  She appears well, not sickly. Discussed to stop Flonase, continue saline nasal spray. Continue Nettie pot rinses.  Offered treatment for which she kindly declined.  She will update next week if no better.

## 2020-12-26 NOTE — Progress Notes (Signed)
Patient ID: Hannah Rivas, female    DOB: 1960/11/30, 60 y.o.   MRN: [id number]  Virtual visit completed through [employer], a video enabled telemedicine application. Due to national recommendations of social distancing due to COVID-19, a virtual visit is felt to be most appropriate for this patient at this time. Reviewed limitations, risks, security and privacy concerns of performing a virtual visit and the availability of in person appointments. I also reviewed that there may be a patient responsible charge related to this service. The patient agreed to proceed.   Patient location: home Provider location: [place] at [facility], office Persons participating in this virtual visit: patient, provider   If any vitals were documented, they were collected by patient at home unless specified below.    Temp 98.6 F (37 C) (Temporal)    CC: nasal congestion Subjective:   HPI: Hannah Rivas is a 60 y.o. female presenting on 12/26/2020 for nasal congestion.   Symptoms began about 8 days ago while traveling in [place], [place] for Thanksgiving.  Symptoms include post nasal drip, nasal congestion, mild cough. She's also noticed dark brown bloody drainage.  She is using saline nasal spray and a Neti Pot rinses at night.  Symptoms have improved since returning home.  She does have her air vaporizer active now.  Overall she feels well.  She denies fevers, chills, body aches.      Relevant past medical, surgical, family and social history reviewed and updated as indicated. Interim medical history since our last visit reviewed. Allergies and medications reviewed and updated. Outpatient Medications Prior to Visit  Medication Sig Dispense Refill   hydrochlorothiazide (HYDRODIURIL) 25 MG tablet Take 1 tablet (25 mg total) by mouth daily. For blood pressure 90 tablet 2   Multiple Vitamin (MULTIVITAMIN) tablet Take 1 tablet by mouth daily.     diclofenac Sodium (VOLTAREN) 1 % GEL Apply 2 g  topically 4 (four) times daily.     No facility-administered medications prior to visit.     Per HPI unless specifically indicated in ROS section below Review of Systems  HENT:  Positive for congestion and postnasal drip. Negative for sinus pressure and sore throat.   Respiratory:  Positive for cough. Negative for shortness of breath.   Objective:  Temp 98.6 F (37 C) (Temporal)   Wt Readings from Last 3 Encounters:  07/04/20 128 lb (58.1 kg)  06/19/20 128 lb (58.1 kg)  05/26/20 129 lb 8 oz (58.7 kg)       Physical exam: General: Alert and oriented x 3, no distress, does not appear sickly  Pulmonary: Speaks in complete sentences without increased work of breathing, no cough during visit.  Psychiatric: Normal mood, thought content, and behavior.     Results for orders placed or performed in visit on 04/16/20  HIV Antibody (routine testing w rflx)  Result Value Ref Range   HIV 1&2 Ab, 4th Generation NON-REACTIVE NON-REACTI  TSH  Result Value Ref Range   TSH 1.60 0.35 - 4.50 uIU/mL  Lipid panel  Result Value Ref Range   Cholesterol 208 (H) 0 - 200 mg/dL   Triglycerides 59.0 0.0 - 149.0 mg/dL   HDL 83.60 >39.00 mg/dL   VLDL 11.8 0.0 - 40.0 mg/dL   LDL Cholesterol 113 (H) 0 - 99 mg/dL   Total CHOL/HDL Ratio 2    NonHDL 124.61   Comprehensive metabolic panel  Result Value Ref Range   Sodium 139 135 - 145 mEq/L   Potassium 3.9 3.5 -  5.1 mEq/L   Chloride 99 96 - 112 mEq/L   CO2 32 19 - 32 mEq/L   Glucose, Bld 88 70 - 99 mg/dL   BUN 14 6 - 23 mg/dL   Creatinine, Ser 0.81 0.40 - 1.20 mg/dL   Total Bilirubin 0.5 0.2 - 1.2 mg/dL   Alkaline Phosphatase 57 39 - 117 U/L   AST 24 0 - 37 U/L   ALT 15 0 - 35 U/L   Total Protein 6.6 6.0 - 8.3 g/dL   Albumin 4.5 3.5 - 5.2 g/dL   GFR 78.37 >60.00 mL/min   Calcium 9.9 8.4 - 10.5 mg/dL  CBC  Result Value Ref Range   WBC 4.2 4.0 - 10.5 K/uL   RBC 5.00 3.87 - 5.11 Mil/uL   Platelets 172.0 150.0 - 400.0 K/uL   Hemoglobin 13.9  12.0 - 15.0 g/dL   HCT 41.6 36.0 - 46.0 %   MCV 83.2 78.0 - 100.0 fl   MCHC 33.4 30.0 - 36.0 g/dL   RDW 12.7 11.5 - 15.5 %   Assessment & Plan:   Problem List Items Addressed This Visit       Other   Nasal congestion    Secondary to environmental changes from traveling to and from higher altitudes.  She appears well, not sickly. Discussed to stop Flonase, continue saline nasal spray. Continue Nettie pot rinses.  Offered treatment for which she kindly declined.  She will update next week if no better.        No orders of the defined types were placed in this encounter.  No orders of the defined types were placed in this encounter.   I discussed the assessment and treatment plan with the patient. The patient was provided an opportunity to ask questions and all were answered. The patient agreed with the plan and demonstrated an understanding of the instructions. The patient was advised to call back or seek an in-person evaluation if the symptoms worsen or if the condition fails to improve as anticipated.  Follow up plan:  Stop the Flonase as it may be contributing to nosebleeds.  Continue the saline nasal spray and Nettie pot rinses.  Please update me if no improvement as discussed.  Pleas Koch, NP

## 2020-12-30 DIAGNOSIS — R0981 Nasal congestion: Secondary | ICD-10-CM

## 2020-12-31 MED ORDER — PREDNISONE 20 MG PO TABS: ORAL_TABLET | ORAL | 0 refills | Status: DC

## 2021-02-11 ENCOUNTER — Other Ambulatory Visit: Payer: Self-pay | Admitting: Primary Care

## 2021-02-11 DIAGNOSIS — R600 Localized edema: Secondary | ICD-10-CM

## 2021-03-04 ENCOUNTER — Other Ambulatory Visit: Payer: Self-pay | Admitting: Primary Care

## 2021-03-04 DIAGNOSIS — Z1231 Encounter for screening mammogram for malignant neoplasm of breast: Secondary | ICD-10-CM

## 2021-03-24 ENCOUNTER — Encounter: Payer: Self-pay | Admitting: Nurse Practitioner

## 2021-03-24 ENCOUNTER — Ambulatory Visit: Payer: Commercial Managed Care - PPO | Admitting: Nurse Practitioner

## 2021-03-24 ENCOUNTER — Other Ambulatory Visit: Payer: Self-pay

## 2021-03-24 VITALS — BP 96/58 | HR 93 | Temp 96.8°F | Resp 10 | Ht 66.5 in | Wt 133.2 lb

## 2021-03-24 DIAGNOSIS — J040 Acute laryngitis: Secondary | ICD-10-CM | POA: Diagnosis not present

## 2021-03-24 DIAGNOSIS — J029 Acute pharyngitis, unspecified: Secondary | ICD-10-CM | POA: Diagnosis not present

## 2021-03-24 DIAGNOSIS — R051 Acute cough: Secondary | ICD-10-CM | POA: Insufficient documentation

## 2021-03-24 HISTORY — DX: Acute laryngitis: J04.0

## 2021-03-24 LAB — POCT RAPID STREP A (OFFICE): Rapid Strep A Screen: NEGATIVE

## 2021-03-24 MED ORDER — BENZONATATE 100 MG PO CAPS: 100.0000 mg | ORAL_CAPSULE | Freq: Three times a day (TID) | ORAL | 0 refills | Status: AC | PRN

## 2021-03-24 NOTE — Assessment & Plan Note (Signed)
Patient symptoms conducive to viral laryngitis.  Did tell patient to continue to monitor symptoms should improve over the next 3 to 4 days.  She did have information reach out to me if she does not improve we will consider antibiotic use at that point.

## 2021-03-24 NOTE — Patient Instructions (Signed)
Nice to see you today Reach out to me if you do not continue to improve by Friday morning. I sent in the cough pills to your pharmacy. I would continue using the Flonase and take Claritin too. Drink plenty of fluids Follow up if symptoms worsen or fail to improve  Delsym is the over the counter cough medication if needed. But I sent in a prescription cough medication to the pharmacy

## 2021-03-24 NOTE — Progress Notes (Signed)
Acute Office Visit  Subjective:    Patient ID: Hannah Rivas, female    DOB: [birth year], 61 y.o.   MRN: [id number]  Chief Complaint  Patient presents with   Cough    Sx started on 03/18/21-post nasal drip, cough, hoarse voice, swollen lymph nodes in the neck. NO fever. Covid test at home negative on 03/23/21.     Patient is in today for Cough  Symptoms started about 6 days ago States that she started with PND and has a history of  bad sinuses.States that she noticed hoarseness and sore throat on Saturday. States that her husband was in last week for something different. A "flu bug" that lasted for less than a week Pfizer vaccine x 2 and one booster Covid test negative yesterday at home Has been using Cough syrup (helped), tylenol (helped), hot tea and cough drops.   States that the cough worse in the morning and then she starts medicating and begins to feel better. States she does feel better today than previous days Past Medical History:  Diagnosis Date   Arthritis    bilateral thumbs   Basal cell carcinoma    Breast cancer ([place]) 2005   LEFT   Edema    lower extremity    Frequent headaches    Personal history of radiation therapy 2005   Left breast    Past Surgical History:  Procedure Laterality Date   BASAL CELL CARCINOMA EXCISION  2012, 2015   BREAST BIOPSY Left 2005   Positive   BREAST BIOPSY Right 1998   neg   BREAST LUMPECTOMY Left 2005   FOOT SURGERY Left 2018   KNEE ARTHROSCOPY Left    MANDIBLE SURGERY     not related to TMJ-orthnatic surgery to assit with migraines   PARTIAL HYSTERECTOMY  2006   REDUCTION MAMMAPLASTY Right 2005   TUBAL LIGATION      Family History  Problem Relation Age of Onset   Heart attack Mother    Stroke Father    Glaucoma Father    Hypertension Father    Breast cancer Maternal Aunt    Colon polyps Neg Hx    Colon cancer Neg Hx    Esophageal cancer Neg Hx    Rectal cancer Neg Hx    Prostate cancer Neg Hx     Social  History   Socioeconomic History   Marital status: Married    Spouse name: Not on file   Number of children: Not on file   Years of education: Not on file   Highest education level: Not on file  Occupational History   Not on file  Tobacco Use   Smoking status: Never   Smokeless tobacco: Never  Vaping Use   Vaping Use: Never used  Substance and Sexual Activity   Alcohol use: Yes    Alcohol/week: 1.0 standard drink    Types: 1 Standard drinks or equivalent per week   Drug use: Never   Sexual activity: Not on file  Other Topics Concern   Not on file  Social History Narrative   Not on file   Social Determinants of Health   Financial Resource Strain: Not on file  Food Insecurity: Not on file  Transportation Needs: Not on file  Physical Activity: Not on file  Stress: Not on file  Social Connections: Not on file  Intimate Partner Violence: Not on file    Outpatient Medications Prior to Visit  Medication Sig Dispense Refill   hydrochlorothiazide (HYDRODIURIL) 25  MG tablet Take 1 tablet (25 mg total) by mouth daily. For blood pressure 90 tablet 2   Multiple Vitamin (MULTIVITAMIN) tablet Take 1 tablet by mouth daily.     predniSONE (DELTASONE) 20 MG tablet Take 2 tablets by mouth once daily for 5 days. 10 tablet 0   No facility-administered medications prior to visit.    No Known Allergies  Review of Systems  Constitutional:  Positive for fatigue. Negative for appetite change, chills and fever.  HENT:  Positive for postnasal drip, sneezing and sore throat. Negative for congestion, ear discharge, ear pain, sinus pressure and sinus pain.   Respiratory:  Positive for cough (dark green in the morning). Negative for shortness of breath.   Cardiovascular:  Negative for chest pain.  Gastrointestinal:  Positive for nausea. Negative for abdominal pain, diarrhea and vomiting.  Musculoskeletal:  Negative for arthralgias and myalgias.  Neurological:  Negative for headaches.       Objective:    Physical Exam Vitals and nursing note reviewed.  Constitutional:      Appearance: Normal appearance.  HENT:     Right Ear: Tympanic membrane, ear canal and external ear normal.     Left Ear: Tympanic membrane, ear canal and external ear normal.     Nose:     Right Sinus: No maxillary sinus tenderness or frontal sinus tenderness.     Left Sinus: No maxillary sinus tenderness or frontal sinus tenderness.     Mouth/Throat:     Mouth: Mucous membranes are moist.     Pharynx: Posterior oropharyngeal erythema present.     Comments: Cobblest[place]  Cardiovascular:     Rate and Rhythm: Normal rate and regular rhythm.     Heart sounds: Normal heart sounds.  Pulmonary:     Effort: Pulmonary effort is normal.     Breath sounds: Normal breath sounds.  Abdominal:     General: Bowel sounds are normal.  Lymphadenopathy:     Cervical: Cervical adenopathy (anterior) present.  Neurological:     Mental Status: She is alert.    BP (!) 96/58    Pulse 93    Temp (!) 96.8 F (36 C)    Resp 10    Ht 5' 6.5" (1.689 m)    Wt 133 lb 4 oz (60.4 kg)    SpO2 99%    BMI 21.18 kg/m  Wt Readings from Last 3 Encounters:  03/24/21 133 lb 4 oz (60.4 kg)  07/04/20 128 lb (58.1 kg)  06/19/20 128 lb (58.1 kg)    Health Maintenance Due  Topic Date Due   Zoster Vaccines- Shingrix (1 of 2) Never done   COVID-19 Vaccine (4 - Booster for Pfizer series) 02/18/2020   INFLUENZA VACCINE  08/25/2020    There are no preventive care reminders to display for this patient.   Lab Results  Component Value Date   TSH 1.60 04/16/2020   Lab Results  Component Value Date   WBC 4.2 04/16/2020   HGB 13.9 04/16/2020   HCT 41.6 04/16/2020   MCV 83.2 04/16/2020   PLT 172.0 04/16/2020   Lab Results  Component Value Date   NA 139 04/16/2020   K 3.9 04/16/2020   CO2 32 04/16/2020   GLUCOSE 88 04/16/2020   BUN 14 04/16/2020   CREATININE 0.81 04/16/2020   BILITOT 0.5 04/16/2020   ALKPHOS 57  04/16/2020   AST 24 04/16/2020   ALT 15 04/16/2020   PROT 6.6 04/16/2020   ALBUMIN 4.5 04/16/2020  CALCIUM 9.9 04/16/2020   GFR 78.37 04/16/2020   Lab Results  Component Value Date   CHOL 208 (H) 04/16/2020   Lab Results  Component Value Date   HDL 83.60 04/16/2020   Lab Results  Component Value Date   LDLCALC 113 (H) 04/16/2020   Lab Results  Component Value Date   TRIG 59.0 04/16/2020   Lab Results  Component Value Date   CHOLHDL 2 04/16/2020   No results found for: HGBA1C     Assessment & Plan:   Problem List Items Addressed This Visit       Respiratory   Laryngitis    Patient symptoms conducive to viral laryngitis.  Did tell patient to continue to monitor symptoms should improve over the next 3 to 4 days.  She did have information reach out to me if she does not improve we will consider antibiotic use at that point.        Other   Acute cough    Send in Tessalon Perles 100 mg 3 times daily as needed for 7 days.  Also gave recommendations for over-the-counter cough syrup      Relevant Medications   benzonatate (TESSALON PERLES) 100 MG capsule   Sore throat - Primary    Likely secondary to postnasal drip.  Patient strep test negative in office today.      Relevant Orders   Rapid Strep A (Completed)     Meds ordered this encounter  Medications   benzonatate (TESSALON PERLES) 100 MG capsule    Sig: Take 1 capsule (100 mg total) by mouth 3 (three) times daily as needed for up to 7 days for cough.    Dispense:  21 capsule    Refill:  0    Order Specific Question:   Supervising Provider    Answer:   Loura Pardon A [[id number]]   This visit occurred during the SARS-CoV-2 public health emergency.  Safety protocols were in place, including screening questions prior to the visit, additional usage of staff PPE, and extensive cleaning of exam room while observing appropriate contact time as indicated for disinfecting solutions.    Romilda Garret, NP

## 2021-03-24 NOTE — Assessment & Plan Note (Signed)
Likely secondary to postnasal drip.  Patient strep test negative in office today.

## 2021-03-24 NOTE — Assessment & Plan Note (Signed)
Send in Tessalon Perles 100 mg 3 times daily as needed for 7 days.  Also gave recommendations for over-the-counter cough syrup

## 2021-03-25 ENCOUNTER — Encounter: Payer: Self-pay | Admitting: Nurse Practitioner

## 2021-03-25 MED ORDER — AZITHROMYCIN 250 MG PO TABS: ORAL_TABLET | ORAL | 0 refills | Status: AC

## 2021-04-01 NOTE — Telephone Encounter (Signed)
Patient calling back, returning call, states she has one more question ? ?[phone] ?

## 2021-04-01 NOTE — Telephone Encounter (Signed)
Can we reach out and see if she would be willing to get a chest xray and where? [facility] or the office here. ? ?Thanks ?

## 2021-04-01 NOTE — Telephone Encounter (Signed)
Patient advised. Patient will come tomorrow to our office for xray. Please place order. Thank you ?

## 2021-04-10 ENCOUNTER — Other Ambulatory Visit: Payer: Self-pay

## 2021-04-10 ENCOUNTER — Ambulatory Visit
Admission: RE | Admit: 2021-04-10 | Discharge: 2021-04-10 | Disposition: A | Discharge status: Home / Self Care | Payer: Commercial Managed Care - PPO | Source: Ambulatory Visit | Attending: Primary Care | Admitting: Primary Care

## 2021-04-10 DIAGNOSIS — Z1231 Encounter for screening mammogram for malignant neoplasm of breast: Secondary | ICD-10-CM | POA: Diagnosis present

## 2021-04-14 ENCOUNTER — Encounter: Payer: Commercial Managed Care - PPO | Admitting: Primary Care

## 2021-04-22 ENCOUNTER — Encounter: Payer: Self-pay | Admitting: Primary Care

## 2021-04-22 ENCOUNTER — Ambulatory Visit (INDEPENDENT_AMBULATORY_CARE_PROVIDER_SITE_OTHER): Payer: Commercial Managed Care - PPO | Admitting: Primary Care

## 2021-04-22 ENCOUNTER — Other Ambulatory Visit: Payer: Self-pay

## 2021-04-22 VITALS — BP 98/52 | HR 72 | Temp 98.2°F | Ht 67.0 in | Wt 127.6 lb

## 2021-04-22 DIAGNOSIS — R519 Headache, unspecified: Secondary | ICD-10-CM

## 2021-04-22 DIAGNOSIS — G8929 Other chronic pain: Secondary | ICD-10-CM

## 2021-04-22 DIAGNOSIS — Z0001 Encounter for general adult medical examination with abnormal findings: Secondary | ICD-10-CM

## 2021-04-22 DIAGNOSIS — R600 Localized edema: Secondary | ICD-10-CM | POA: Diagnosis not present

## 2021-04-22 DIAGNOSIS — E782 Mixed hyperlipidemia: Secondary | ICD-10-CM

## 2021-04-22 DIAGNOSIS — Z0000 Encounter for general adult medical examination without abnormal findings: Secondary | ICD-10-CM

## 2021-04-22 LAB — LIPID PANEL
Cholesterol: 210 mg/dL — ABNORMAL HIGH (ref 0–200)
HDL: 84.9 mg/dL (ref >39.00)
LDL Cholesterol: 112 mg/dL — ABNORMAL HIGH (ref 0–99)
NonHDL: 125.21
Total CHOL/HDL Ratio: 2
Triglycerides: 68 mg/dL (ref 0.0–149.0)
VLDL: 13.6 mg/dL (ref 0.0–40.0)

## 2021-04-22 LAB — COMPREHENSIVE METABOLIC PANEL
ALT: 18 U/L (ref 0–35)
AST: 27 U/L (ref 0–37)
Albumin: 4.4 g/dL (ref 3.5–5.2)
Alkaline Phosphatase: 60 U/L (ref 39–117)
BUN: 16 mg/dL (ref 6–23)
CO2: 33 mEq/L — ABNORMAL HIGH (ref 19–32)
Calcium: 9.4 mg/dL (ref 8.4–10.5)
Chloride: 94 mEq/L — ABNORMAL LOW (ref 96–112)
Creatinine, Ser: 0.78 mg/dL (ref 0.40–1.20)
GFR: 81.42 mL/min (ref >60.00)
Glucose, Bld: 95 mg/dL (ref 70–99)
Potassium: 3.3 mEq/L — ABNORMAL LOW (ref 3.5–5.1)
Sodium: 136 mEq/L (ref 135–145)
Total Bilirubin: 0.6 mg/dL (ref 0.2–1.2)
Total Protein: 6.7 g/dL (ref 6.0–8.3)

## 2021-04-22 MED ORDER — HYDROCHLOROTHIAZIDE 12.5 MG PO TABS: 12.5000 mg | ORAL_TABLET | Freq: Every day | ORAL | 0 refills | Status: DC

## 2021-04-22 NOTE — Assessment & Plan Note (Signed)
Declines Shingrix vaccines.  Other vaccines up-to-date. ?Mammogram up-to-date. ?Colonoscopy up-to-date, due 2029. ? ?Encouraged healthy diet regular exercise. ? ?Exam today as mentioned ?Labs pending. ? ?

## 2021-04-22 NOTE — Patient Instructions (Addendum)
Stop by the lab prior to leaving today. I will notify you of your results once received.  ? ?Please send me the information for ENT, endocrinology, and vascular services referrals. ? ?It was a pleasure to see you today! ? ?Preventive Care 64-63 Years Old, Female ?Preventive care refers to lifestyle choices and visits with your health care provider that can promote health and wellness. Preventive care visits are also called wellness exams. ?What can I expect for my preventive care visit? ?Counseling ?Your health care provider may ask you questions about your: ?Medical history, including: ?Past medical problems. ?Family medical history. ?Pregnancy history. ?Current health, including: ?Menstrual cycle. ?Method of birth control. ?Emotional well-being. ?Home life and relationship well-being. ?Sexual activity and sexual health. ?Lifestyle, including: ?Alcohol, nicotine or tobacco, and drug use. ?Access to firearms. ?Diet, exercise, and sleep habits. ?Work and work Statistician. ?Sunscreen use. ?Safety issues such as seatbelt and bike helmet use. ?Physical exam ?Your health care provider will check your: ?Height and weight. These may be used to calculate your BMI (body mass index). BMI is a measurement that tells if you are at a healthy weight. ?Waist circumference. This measures the distance around your waistline. This measurement also tells if you are at a healthy weight and may help predict your risk of certain diseases, such as type 2 diabetes and high blood pressure. ?Heart rate and blood pressure. ?Body temperature. ?Skin for abnormal spots. ?What immunizations do I need? ?Vaccines are usually given at various ages, according to a schedule. Your health care provider will recommend vaccines for you based on your age, medical history, and lifestyle or other factors, such as travel or where you work. ?What tests do I need? ?Screening ?Your health care provider may recommend screening tests for certain conditions. This  may include: ?Lipid and cholesterol levels. ?Diabetes screening. This is done by checking your blood sugar (glucose) after you have not eaten for a while (fasting). ?Pelvic exam and Pap test. ?Hepatitis B test. ?Hepatitis C test. ?HIV (human immunodeficiency virus) test. ?STI (sexually transmitted infection) testing, if you are at risk. ?Lung cancer screening. ?Colorectal cancer screening. ?Mammogram. Talk with your health care provider about when you should start having regular mammograms. This may depend on whether you have a family history of breast cancer. ?BRCA-related cancer screening. This may be done if you have a family history of breast, ovarian, tubal, or peritoneal cancers. ?Bone density scan. This is done to screen for osteoporosis. ?Talk with your health care provider about your test results, treatment options, and if necessary, the need for more tests. ?Follow these instructions at home: ?Eating and drinking ? ?Eat a diet that includes fresh fruits and vegetables, whole grains, lean protein, and low-fat dairy products. ?Take vitamin and mineral supplements as recommended by your health care provider. ?Do not drink alcohol if: ?Your health care provider tells you not to drink. ?You are pregnant, may be pregnant, or are planning to become pregnant. ?If you drink alcohol: ?Limit how much you have to 0-1 drink a day. ?Know how much alcohol is in your drink. In the [place] one drink equals one 12 oz bottle of beer (355 mL), one 5 oz glass of wine (148 mL), or one 1? oz glass of hard liquor (44 mL). ?Lifestyle ?Brush your teeth every morning and night with fluoride toothpaste. Floss one time each day. ?Exercise for at least 30 minutes 5 or more days each week. ?Do not use any products that contain nicotine or tobacco. These products include  cigarettes, chewing tobacco, and vaping devices, such as e-cigarettes. If you need help quitting, ask your health care provider. ?Do not use drugs. ?If you are sexually  active, practice safe sex. Use a condom or other form of protection to prevent STIs. ?If you do not wish to become pregnant, use a form of birth control. If you plan to become pregnant, see your health care provider for a prepregnancy visit. ?Take aspirin only as told by your health care provider. Make sure that you understand how much to take and what form to take. Work with your health care provider to find out whether it is safe and beneficial for you to take aspirin daily. ?Find healthy ways to manage stress, such as: ?Meditation, yoga, or listening to music. ?Journaling. ?Talking to a trusted person. ?Spending time with friends and family. ?Minimize exposure to UV radiation to reduce your risk of skin cancer. ?Safety ?Always wear your seat belt while driving or riding in a vehicle. ?Do not drive: ?If you have been drinking alcohol. Do not ride with someone who has been drinking. ?When you are tired or distracted. ?While texting. ?If you have been using any mind-altering substances or drugs. ?Wear a helmet and other protective equipment during sports activities. ?If you have firearms in your house, make sure you follow all gun safety procedures. ?Seek help if you have been physically or sexually abused. ?What's next? ?Visit your health care provider once a year for an annual wellness visit. ?Ask your health care provider how often you should have your eyes and teeth checked. ?Stay up to date on all vaccines. ?This information is not intended to replace advice given to you by your health care provider. Make sure you discuss any questions you have with your health care provider. ?Document Revised: 07/09/2020 Document Reviewed: 07/09/2020 ?Elsevier Patient Education ? [place]. ? ?

## 2021-04-22 NOTE — Assessment & Plan Note (Signed)
Unclear if this is truly sinus related, or if she experiences chronic cluster headaches. ? ?Reviewed CT maxillofacial from 2021 which was completely clear in all sinuses.  Will place referral to ENT in [facility] for second opinion per patient request. ? ?If headaches determined not to be secondary to chronic sinusitis, then consider daily preventative headache treatment. ?

## 2021-04-22 NOTE — Progress Notes (Signed)
? ?Subjective:  ? ? Patient ID: Hannah Rivas, female    DOB: Jan 07, 1961, 61 y.o.   MRN: [id number] ? ?HPI ? ?Hannah Rivas is a very pleasant 61 y.o. female who presents today for complete physical and follow up of chronic conditions. She would also like to discuss several symptoms and is requesting three referrals.  ? ?She would like to discuss chronic headaches. Chronic for years. Headaches are chronically located behind the left eye, feels like a "throbbing", sometimes can last for days. She does notice increased headaches with pollen and seasonal changes. Evaluated by ENT in 2021, underwent CT maxillofacial which was negative for sinus congestion. She has undergone allergy testing, tested positive for ragweed. She denies photophobia, phonophobia, nausea. 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 She has been tracking headaches at home. She has a family history of chronic sinus congestion in her brother and father.  ? ?She would also like a referral to endocrinology and vascular services for evaluation of chronic lower extremity edema. She found an endocrinologist in [place] that will complete hormonal testing for causes of lower extremity edema. She has the name of the endocrinologist at home. She would like to see vascular services for chronic spider veins.  ? ?Immunizations: ?-Tetanus: 2020 ?-Influenza: Completed last season ?-Covid-19: 3 vaccines ?-Shingles: Completed Zostavax in 2017, declines  ? ?Diet: Fair diet.  ?Exercise: Exercising 3-4 days weekly ? ?Eye exam: Completes annually  ?Dental exam: Completes semi-annually  ? ?Pap Smear: Completed partial hysterectomy ?Mammogram: Completed in March 2023 ?Colonoscopy: Completed in 2022, due 2029 ? ?BP Readings from Last 3 Encounters:  ?04/22/21 (!) 98/52  ?03/24/21 (!) 96/58  ?07/04/20 99/62  ? ? ? ? ? ?Review of Systems  ?Constitutional:  Negative for unexpected weight change.  ?HENT:  Positive for sinus pressure. Negative for rhinorrhea.    ?Respiratory:  Negative for cough and shortness of breath.   ?Cardiovascular:  Positive for leg swelling. Negative for chest pain.  ?Gastrointestinal:  Negative for constipation and diarrhea.  ?Genitourinary:  Negative for difficulty urinating.  ?Musculoskeletal:  Negative for arthralgias and myalgias.  ?Skin:  Negative for rash.  ?Allergic/Immunologic: Positive for environmental allergies.  ?Neurological:  Positive for headaches. Negative for dizziness.  ?Psychiatric/Behavioral:  The patient is not nervous/anxious.   ? ?   ? ? ?Past Medical History:  ?Diagnosis Date  ? Arthritis   ? bilateral thumbs  ? Basal cell carcinoma   ? Breast cancer ([place]) 2005  ? LEFT  ? Edema   ? lower extremity   ? Frequent headaches   ? Personal history of radiation therapy 2005  ? Left breast  ? ? ?Social History  ? ?Socioeconomic History  ? Marital status: Married  ?  Spouse name: Not on file  ? Number of children: Not on file  ? Years of education: Not on file  ? Highest education level: Not on file  ?Occupational History  ? Not on file  ?Tobacco Use  ? Smoking status: Never  ? Smokeless tobacco: Never  ?Vaping Use  ? Vaping Use: Never used  ?Substance and Sexual Activity  ? Alcohol use: Yes  ?  Alcohol/week: 1.0 standard drink  ?  Types: 1 Standard drinks or equivalent per week  ? Drug use: Never  ? Sexual activity: Not on file  ?Other Topics Concern  ? Not on file  ?Social History Narrative  ? Not on file  ? ?Social Determinants of Health  ? ?Financial Resource Strain:  Not on file  ?Food Insecurity: Not on file  ?Transportation Needs: Not on file  ?Physical Activity: Not on file  ?Stress: Not on file  ?Social Connections: Not on file  ?Intimate Partner Violence: Not on file  ? ? ?Past Surgical History:  ?Procedure Laterality Date  ? BASAL CELL CARCINOMA EXCISION  2012, 2015  ? BREAST BIOPSY Left 2005  ? Positive  ? BREAST BIOPSY Right 1998  ? neg  ? BREAST LUMPECTOMY Left 2005  ? FOOT SURGERY Left 2018  ? KNEE ARTHROSCOPY Left   ?  MANDIBLE SURGERY    ? not related to TMJ-orthnatic surgery to assit with migraines  ? PARTIAL HYSTERECTOMY  2006  ? REDUCTION MAMMAPLASTY Right 2005  ? TUBAL LIGATION    ? ? ?Family History  ?Problem Relation Age of Onset  ? Heart attack Mother   ? Stroke Father   ? Glaucoma Father   ? Hypertension Father   ? Breast cancer Maternal Aunt   ? Colon polyps Neg Hx   ? Colon cancer Neg Hx   ? Esophageal cancer Neg Hx   ? Rectal cancer Neg Hx   ? Prostate cancer Neg Hx   ? ? ?No Known Allergies ? ?Current Outpatient Medications on File Prior to Visit  ?Medication Sig Dispense Refill  ? hydrochlorothiazide (HYDRODIURIL) 25 MG tablet Take 1 tablet (25 mg total) by mouth daily. For blood pressure 90 tablet 2  ? Multiple Vitamin (MULTIVITAMIN) tablet Take 1 tablet by mouth daily.    ? ?No current facility-administered medications on file prior to visit.  ? ? ?BP (!) 98/52 (BP Location: Left Arm, Patient Position: Sitting, Cuff Size: Normal)   Pulse 72   Temp 98.2 ?F (36.8 ?C) (Oral)   Ht '5\' 7"'$  (1.702 m)   Wt 127 lb 9.6 oz (57.9 kg)   SpO2 98%   BMI 19.98 kg/m?  ?Objective:  ? Physical Exam ?HENT:  ?   Right Ear: Tympanic membrane and ear canal normal.  ?   Left Ear: Tympanic membrane and ear canal normal.  ?   Nose: Nose normal.  ?Eyes:  ?   Conjunctiva/sclera: Conjunctivae normal.  ?   Pupils: Pupils are equal, round, and reactive to light.  ?Neck:  ?   Thyroid: No thyromegaly.  ?Cardiovascular:  ?   Rate and Rhythm: Normal rate and regular rhythm.  ?   Heart sounds: No murmur heard. ?   Comments: No obvious lower extremity edema noted on exam.  ?Pulmonary:  ?   Effort: Pulmonary effort is normal.  ?   Breath sounds: Normal breath sounds. No rales.  ?Abdominal:  ?   General: Bowel sounds are normal.  ?   Palpations: Abdomen is soft.  ?   Tenderness: There is no abdominal tenderness.  ?Musculoskeletal:     ?   General: Normal range of motion.  ?   Cervical back: Neck supple.  ?Lymphadenopathy:  ?   Cervical: No cervical  adenopathy.  ?Skin: ?   General: Skin is warm and dry.  ?   Findings: No rash.  ?Neurological:  ?   Mental Status: She is alert and oriented to person, place, and time.  ?   Cranial Nerves: No cranial nerve deficit.  ?   Deep Tendon Reflexes: Reflexes are normal and symmetric.  ?Psychiatric:     ?   Mood and Affect: Mood normal.  ? ? ? ? ? ?   ?Assessment & Plan:  ? ? ? ? ?  This visit occurred during the SARS-CoV-2 public health emergency.  Safety protocols were in place, including screening questions prior to the visit, additional usage of staff PPE, and extensive cleaning of exam room while observing appropriate contact time as indicated for disinfecting solutions.  ?

## 2021-04-22 NOTE — Assessment & Plan Note (Signed)
Repeat lipid panel pending. 

## 2021-04-22 NOTE — Assessment & Plan Note (Addendum)
Chronically managed on HCTZ 25 mg, initiated by provider prior to her establishment in our office.Marland Kitchen  ?BP today low, historically low on HCTZ 25 mg.  ? ?Recommended we reduce HCTZ to 12.5 mg given her lower BP readings, she agrees. Rx for HCTZ 12.5 mg sent to pharmacy. She will update. ? ?Referral to be placed to endocrinology and vascular services per patient request, she will provide [place] with information via MyChart. ?

## 2021-04-23 ENCOUNTER — Other Ambulatory Visit: Payer: Self-pay | Admitting: Primary Care

## 2021-04-23 DIAGNOSIS — R600 Localized edema: Secondary | ICD-10-CM

## 2021-04-23 DIAGNOSIS — E876 Hypokalemia: Secondary | ICD-10-CM

## 2021-04-28 ENCOUNTER — Telehealth: Payer: Self-pay | Admitting: Primary Care

## 2021-04-28 NOTE — Telephone Encounter (Signed)
Pt called in stated she was returning a call she receive today ?

## 2021-05-11 ENCOUNTER — Other Ambulatory Visit (INDEPENDENT_AMBULATORY_CARE_PROVIDER_SITE_OTHER): Payer: Commercial Managed Care - PPO

## 2021-05-11 DIAGNOSIS — E876 Hypokalemia: Secondary | ICD-10-CM | POA: Diagnosis not present

## 2021-05-11 LAB — POTASSIUM: Potassium: 4 mEq/L (ref 3.5–5.1)

## 2021-05-13 DIAGNOSIS — E876 Hypokalemia: Secondary | ICD-10-CM

## 2021-05-13 DIAGNOSIS — R600 Localized edema: Secondary | ICD-10-CM

## 2021-05-17 ENCOUNTER — Other Ambulatory Visit: Payer: Self-pay | Admitting: Primary Care

## 2021-05-17 DIAGNOSIS — R600 Localized edema: Secondary | ICD-10-CM

## 2021-05-29 ENCOUNTER — Other Ambulatory Visit (INDEPENDENT_AMBULATORY_CARE_PROVIDER_SITE_OTHER): Payer: Commercial Managed Care - PPO

## 2021-05-29 DIAGNOSIS — E876 Hypokalemia: Secondary | ICD-10-CM

## 2021-05-29 DIAGNOSIS — R600 Localized edema: Secondary | ICD-10-CM | POA: Diagnosis not present

## 2021-05-29 LAB — POTASSIUM: Potassium: 3.2 mEq/L — ABNORMAL LOW (ref 3.5–5.1)

## 2021-06-01 ENCOUNTER — Telehealth: Payer: Self-pay

## 2021-06-01 NOTE — Telephone Encounter (Signed)
Fax received from [facility] in response to referral received  ? ? ?

## 2021-06-02 NOTE — Telephone Encounter (Signed)
Please notify patient that endocrinology has denied her referral request. ?I recommend she start with vascular services given her swelling history.  They can provide further recommendations that may be supported. ?

## 2021-06-03 ENCOUNTER — Other Ambulatory Visit: Payer: Self-pay | Admitting: Primary Care

## 2021-06-03 MED ORDER — POTASSIUM CHLORIDE CRYS ER 20 MEQ PO TBCR: 20.0000 meq | EXTENDED_RELEASE_TABLET | Freq: Every day | ORAL | 0 refills | Status: DC

## 2021-06-03 NOTE — Telephone Encounter (Signed)
Called patient she would like to try Vascular services in [place]. Advised that if she does not hear from them in 2 weeks to let us know.  ?

## 2021-06-03 NOTE — Telephone Encounter (Signed)
A referral for vascular services was placed on 04/24/2021. ?Miquel Dunn, can you look into this? ?

## 2021-06-03 NOTE — Addendum Note (Signed)
Addended by: Pleas Koch on: 06/03/2021 01:17 PM ? ? Modules accepted: Orders ? ?

## 2021-06-15 NOTE — Telephone Encounter (Addendum)
Referral was sent to their office in March. Not sure what the delay is. The patient can call to schedule. They have the referral, its on their WQ. Pt aware to call.   [facility] Address: [address], [place] [address] Phone: [phone]

## 2021-06-19 ENCOUNTER — Other Ambulatory Visit: Payer: Self-pay | Admitting: Primary Care

## 2021-06-19 DIAGNOSIS — E876 Hypokalemia: Secondary | ICD-10-CM

## 2021-06-20 ENCOUNTER — Other Ambulatory Visit: Payer: Self-pay | Admitting: Family Medicine

## 2021-06-20 DIAGNOSIS — R600 Localized edema: Secondary | ICD-10-CM

## 2021-06-22 NOTE — Addendum Note (Signed)
Encounter addended by: Annie Paras on: 06/22/2021 11:31 AM  Actions taken: Letter saved

## 2021-06-23 ENCOUNTER — Other Ambulatory Visit (INDEPENDENT_AMBULATORY_CARE_PROVIDER_SITE_OTHER): Payer: Commercial Managed Care - PPO

## 2021-06-23 DIAGNOSIS — E876 Hypokalemia: Secondary | ICD-10-CM

## 2021-06-23 LAB — POTASSIUM: Potassium: 3.9 mEq/L (ref 3.5–5.1)

## 2021-07-02 IMAGING — MG MM DIGITAL SCREENING BILAT W/ TOMO AND CAD
6 series · 6 of 18 positions shown · non-contrast
Comparison: Previous exam(s).

CLINICAL DATA: Screening.

EXAM:
DIGITAL SCREENING BILATERAL MAMMOGRAM WITH TOMOSYNTHESIS AND CAD
TECHNIQUE: Bilateral screening digital craniocaudal and mediolateral oblique
mammograms were obtained. Bilateral screening digital breast
tomosynthesis was performed. The images were evaluated with
computer-aided detection.

[R MLO synth-2D]
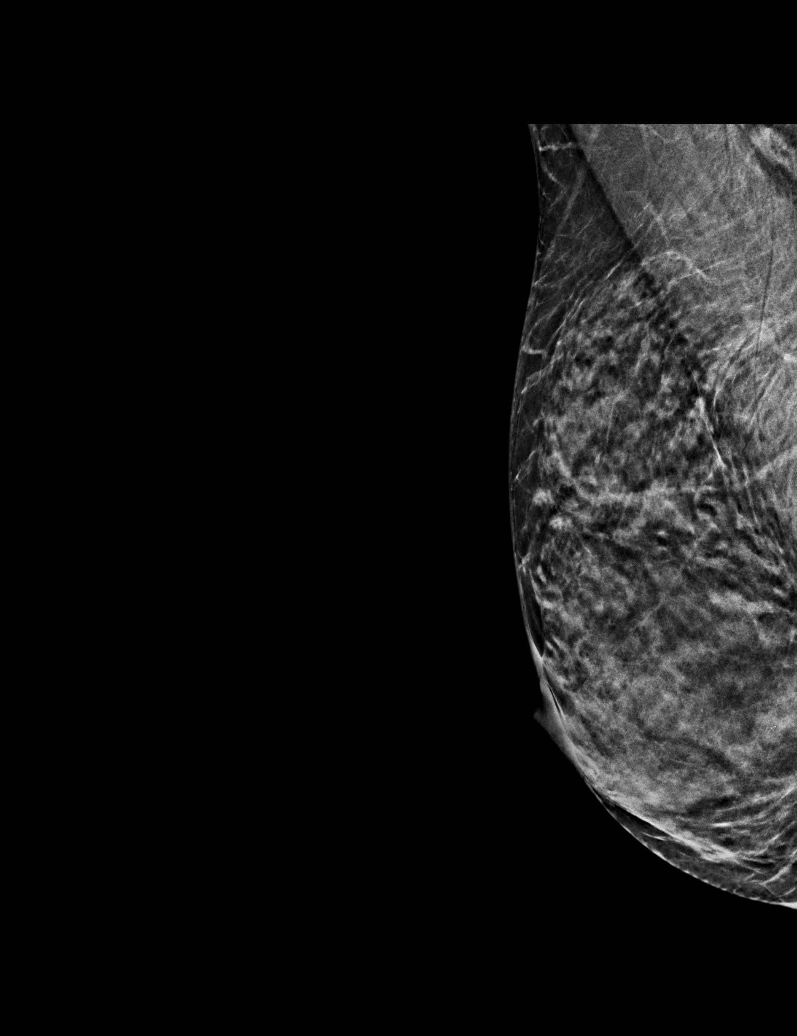

[L CC synth-2D]
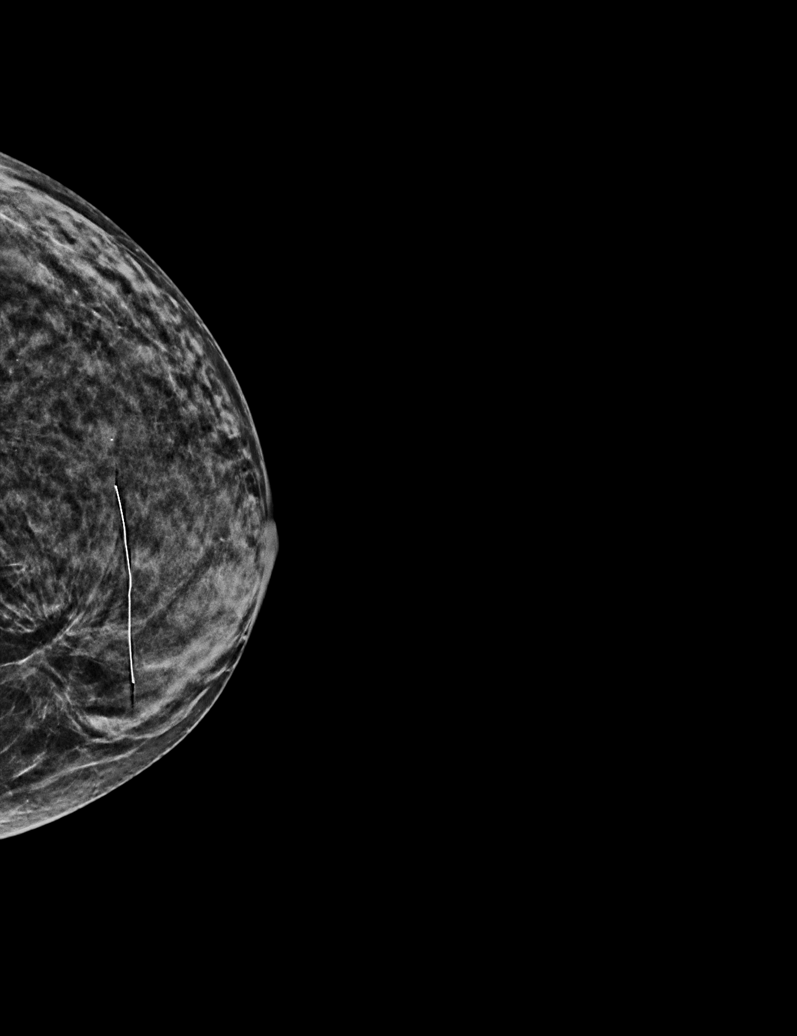

[L MLO synth-2D]
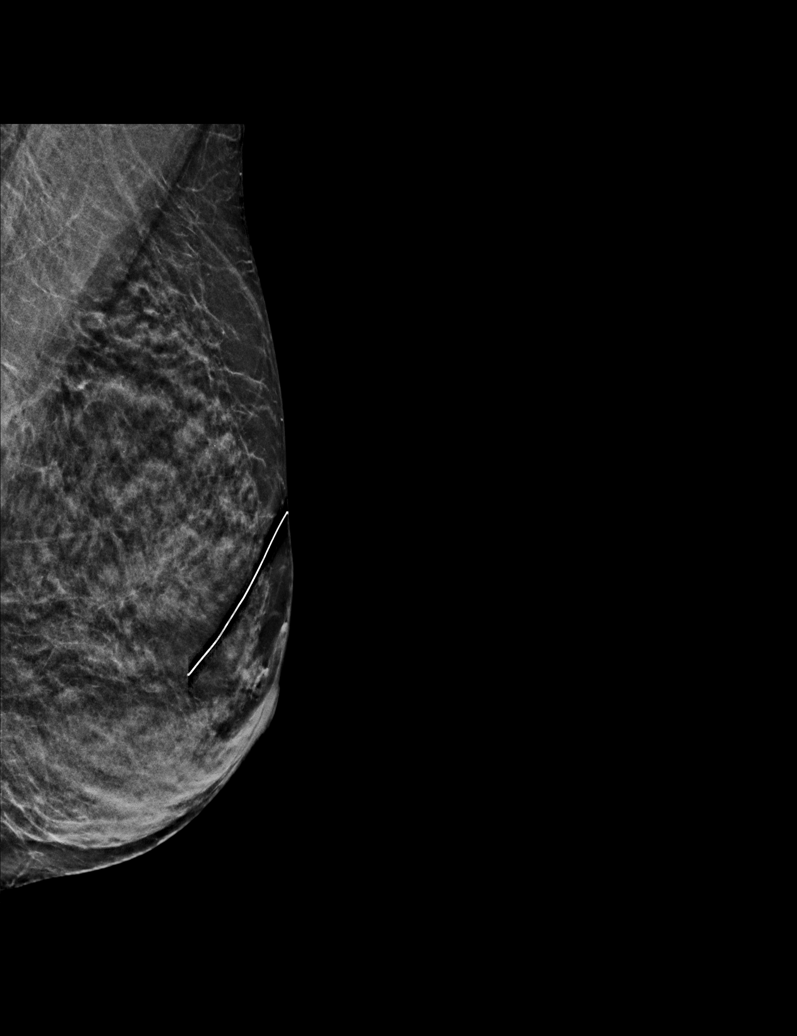

[L MLO tomo · tomo slice 22/43.0]
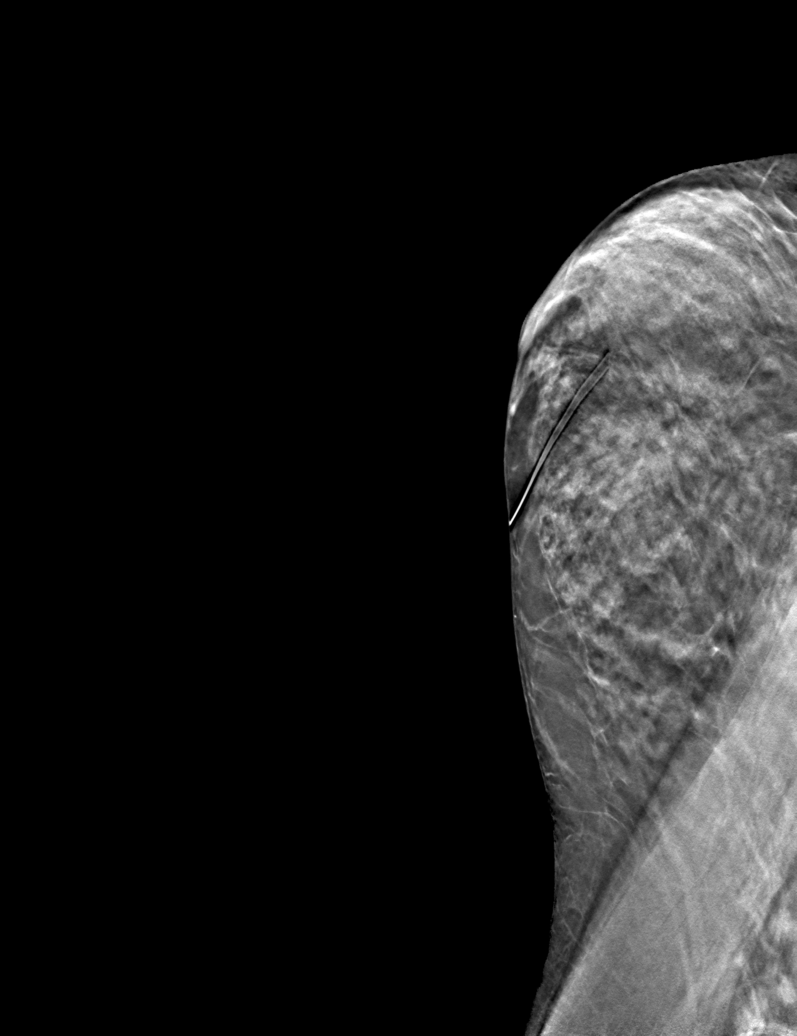

[R CC tomo · tomo slice 23/44.0]
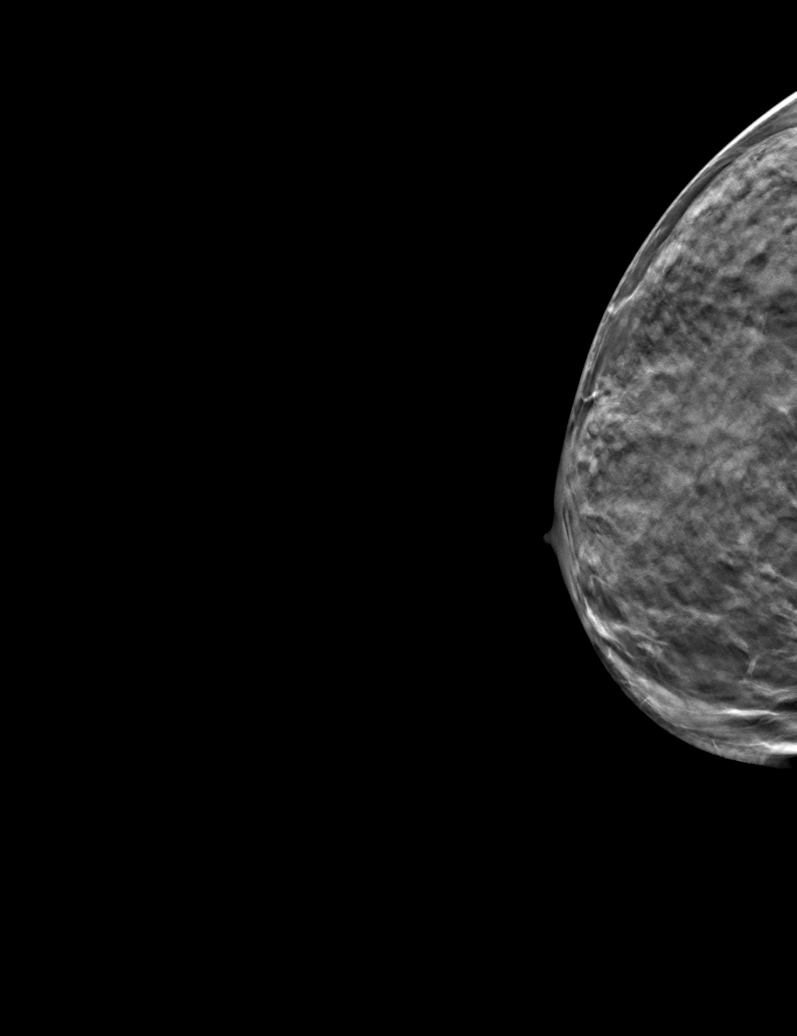

[R MLO tomo · tomo slice 22/43.0]
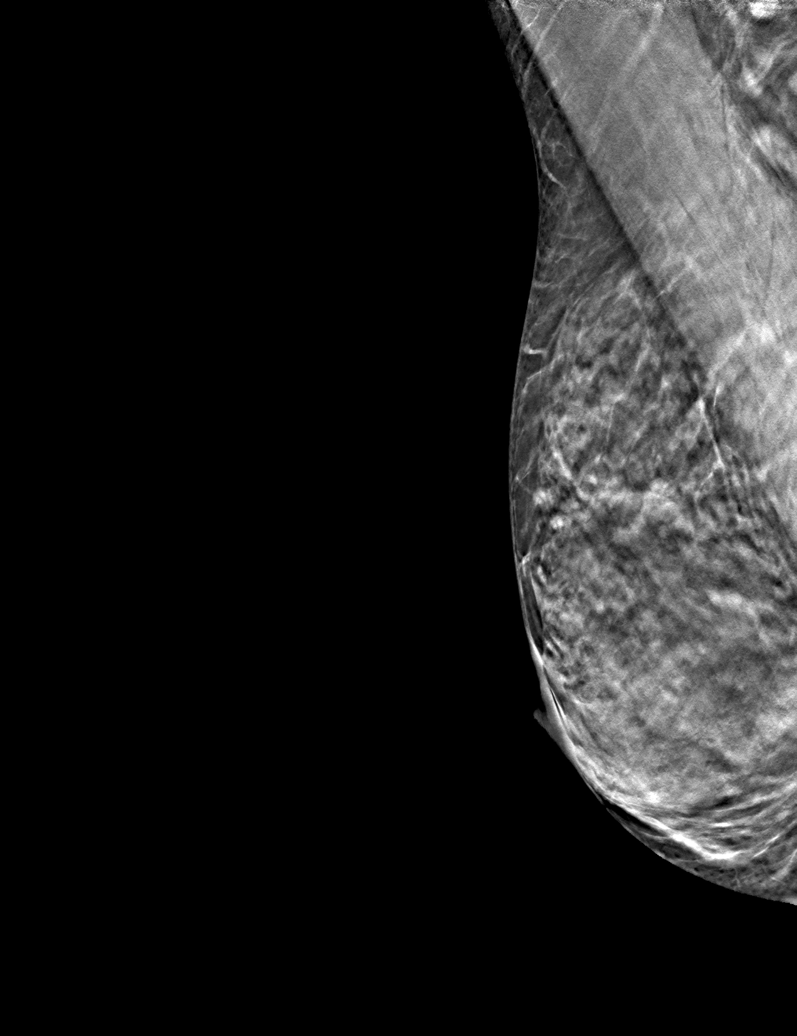

[6 of 18 positions shown; findings below may reference images not displayed]

ACR Breast Density Category d: The breast tissue is extremely dense,
which lowers the sensitivity of mammography
FINDINGS: There are no findings suspicious for malignancy. The images were
evaluated with computer-aided detection.
IMPRESSION: No mammographic evidence of malignancy. A result letter of this
screening mammogram will be mailed directly to the patient.

RECOMMENDATION:
Screening mammogram in one year. (Code:95-0-E9V)

BI-RADS CATEGORY  1: Negative.

## 2021-07-27 ENCOUNTER — Other Ambulatory Visit: Payer: Self-pay | Admitting: Primary Care

## 2021-07-27 DIAGNOSIS — E876 Hypokalemia: Secondary | ICD-10-CM

## 2021-08-03 ENCOUNTER — Encounter (INDEPENDENT_AMBULATORY_CARE_PROVIDER_SITE_OTHER): Payer: Self-pay | Admitting: Vascular Surgery

## 2021-08-03 ENCOUNTER — Other Ambulatory Visit: Payer: Commercial Managed Care - PPO

## 2021-08-03 ENCOUNTER — Ambulatory Visit (INDEPENDENT_AMBULATORY_CARE_PROVIDER_SITE_OTHER): Payer: Commercial Managed Care - PPO | Admitting: Vascular Surgery

## 2021-08-03 VITALS — BP 106/70 | HR 67 | Resp 17 | Ht 67.0 in | Wt 128.0 lb

## 2021-08-03 DIAGNOSIS — E782 Mixed hyperlipidemia: Secondary | ICD-10-CM | POA: Diagnosis not present

## 2021-08-03 DIAGNOSIS — M7989 Other specified soft tissue disorders: Secondary | ICD-10-CM

## 2021-08-03 DIAGNOSIS — I890 Lymphedema, not elsewhere classified: Secondary | ICD-10-CM

## 2021-08-03 DIAGNOSIS — M79669 Pain in unspecified lower leg: Secondary | ICD-10-CM | POA: Diagnosis not present

## 2021-08-03 NOTE — Progress Notes (Signed)
MRN : [id number]  Hannah Rivas is a 61 y.o. ([birth year]) female who presents with chief complaint of legs swell.  History of Present Illness:   Patient is seen for evaluation of leg swelling. The patient first noticed the swelling remotely but is now concerned because of a significant increase in the overall edema. The swelling isn't associated with significant pain.  There has been an increasing amount of  discoloration noted by the patient. The patient notes that in the morning the legs are improved but they steadily worsened throughout the course of the day. Elevation seems to make the swelling of the legs better, dependency makes them much worse.   There is no history of ulcerations associated with the swelling.   The patient denies any recent changes in their medications.  The patient has not been wearing graduated compression.  The patient has no had any past angiography, interventions or vascular surgery.  The patient denies a history of DVT or PE. There is no prior history of phlebitis. There is no history of primary lymphedema.  There is no history of radiation treatment to the groin or pelvis No history of malignancies. No history of trauma or groin or pelvic surgery. No history of foreign travel or parasitic infections area    No outpatient medications have been marked as taking for the 08/03/21 encounter (Appointment) with Delana Meyer, Dolores Lory, MD.    Past Medical History:  Diagnosis Date   Arthritis    bilateral thumbs   Basal cell carcinoma    Breast cancer ([place]) 2005   LEFT   Edema    lower extremity    Frequent headaches    Laryngitis 03/24/2021   Personal history of radiation therapy 2005   Left breast    Past Surgical History:  Procedure Laterality Date   BASAL CELL CARCINOMA EXCISION  2012, 2015   BREAST BIOPSY Left 2005   Positive   BREAST BIOPSY Right 1998   neg   BREAST LUMPECTOMY Left 2005   FOOT SURGERY Left 2018   KNEE ARTHROSCOPY  Left    MANDIBLE SURGERY     not related to TMJ-orthnatic surgery to assit with migraines   PARTIAL HYSTERECTOMY  2006   REDUCTION MAMMAPLASTY Right 2005   TUBAL LIGATION      Social History Social History   Tobacco Use   Smoking status: Never   Smokeless tobacco: Never  Vaping Use   Vaping Use: Never used  Substance Use Topics   Alcohol use: Yes    Alcohol/week: 1.0 standard drink of alcohol    Types: 1 Standard drinks or equivalent per week   Drug use: Never    Family History Family History  Problem Relation Age of Onset   Heart attack Mother    Stroke Father    Glaucoma Father    Hypertension Father    Breast cancer Maternal Aunt    Colon polyps Neg Hx    Colon cancer Neg Hx    Esophageal cancer Neg Hx    Rectal cancer Neg Hx    Prostate cancer Neg Hx     No Known Allergies   REVIEW OF SYSTEMS (Negative unless checked)  Constitutional: '[]'$ Weight loss  '[]'$ Fever  '[]'$ Chills Cardiac: '[]'$ Chest pain   '[]'$ Chest pressure   '[]'$ Palpitations   '[]'$ Shortness of breath when laying flat   '[]'$ Shortness of breath with exertion. Vascular:  '[]'$ Pain in legs with walking   '[x]'$ Pain in legs with standing  '[]'$ History of DVT   '[]'$   Phlebitis   '[x]'$ Swelling in legs   '[]'$ Varicose veins   '[]'$ Non-healing ulcers Pulmonary:   '[]'$ Uses home oxygen   '[]'$ Productive cough   '[]'$ Hemoptysis   '[]'$ Wheeze  '[]'$ COPD   '[]'$ Asthma Neurologic:  '[]'$ Dizziness   '[]'$ Seizures   '[]'$ History of stroke   '[]'$ History of TIA  '[]'$ Aphasia   '[]'$ Vissual changes   '[]'$ Weakness or numbness in arm   '[]'$ Weakness or numbness in leg Musculoskeletal:   '[]'$ Joint swelling   '[]'$ Joint pain   '[]'$ Low back pain Hematologic:  '[]'$ Easy bruising  '[]'$ Easy bleeding   '[]'$ Hypercoagulable state   '[]'$ Anemic Gastrointestinal:  '[]'$ Diarrhea   '[]'$ Vomiting  '[]'$ Gastroesophageal reflux/heartburn   '[]'$ Difficulty swallowing. Genitourinary:  '[]'$ Chronic kidney disease   '[]'$ Difficult urination  '[]'$ Frequent urination   '[]'$ Blood in urine Skin:  '[]'$ Rashes   '[]'$ Ulcers  Psychological:  '[]'$ History of anxiety   '[]'$   History of major depression.  Physical Examination  There were no vitals filed for this visit. There is no height or weight on file to calculate BMI. Gen: WD/WN, NAD Head: [place]/AT, No temporalis wasting.  Ear/Nose/Throat: Hearing grossly intact, nares w/o erythema or drainage, pinna without lesions Eyes: PER, EOMI, sclera nonicteric.  Neck: Supple, no gross masses.  No JVD.  Pulmonary:  Good air movement, no audible wheezing, no use of accessory muscles.  Cardiac: RRR, precordium not hyperdynamic. Vascular:  scattered varicosities present bilaterally.  Mild venous stasis changes to the legs bilaterally.  2+ soft pitting edema  Vessel Right Left  Radial Palpable Palpable  Gastrointestinal: soft, non-distended. No guarding/no peritoneal signs.  Musculoskeletal: M/S 5/5 throughout.  No deformity.  Neurologic: CN 2-12 intact. Pain and light touch intact in extremities.  Symmetrical.  Speech is fluent. Motor exam as listed above. Psychiatric: Judgment intact, Mood & affect appropriate for pt's clinical situation. Dermatologic: Venous rashes no ulcers noted.  No changes consistent with cellulitis. Lymph : No lichenification or skin changes of chronic lymphedema.  CBC Lab Results  Component Value Date   WBC 4.2 04/16/2020   HGB 13.9 04/16/2020   HCT 41.6 04/16/2020   MCV 83.2 04/16/2020   PLT 172.0 04/16/2020    BMET    Component Value Date/Time   NA 136 04/22/2021 0916   K 3.9 06/23/2021 0858   CL 94 (L) 04/22/2021 0916   CO2 33 (H) 04/22/2021 0916   GLUCOSE 95 04/22/2021 0916   BUN 16 04/22/2021 0916   CREATININE 0.78 04/22/2021 0916   CALCIUM 9.4 04/22/2021 0916   CrCl cannot be calculated (Patient's most recent lab result is older than the maximum 21 days allowed.).  COAG No results found for: "INR", "PROTIME"  Radiology No results found.   Assessment/Plan 1. Pain and swelling of lower leg, unspecified laterality Recommend:  No surgery or intervention at this  point in time.  I have reviewed my discussion with the patient regarding venous insufficiency and why it causes symptoms. I have discussed with the patient the chronic skin changes that accompany venous insufficiency and the long term sequela such as ulceration. Patient will contnue wearing graduated compression stockings on a daily basis, as this has provided excellent control of his edema. The patient will put the stockings on first thing in the morning and removing them in the evening. The patient is reminded not to sleep in the stockings.  In addition, behavioral modification including elevation during the day will be initiated. Exercise is strongly encouraged.  Previous duplex ultrasound of the lower extremities shows normal deep system, no significant superficial reflux was identified.  Given  the patient's good control and lack of any problems regarding the venous insufficiency and lymphedema a lymph pump in not need at this time.    The patient will follow up with me PRN should anything change.  The patient voices agreement with this plan.   2. Lymphedema Recommend:  No surgery or intervention at this point in time.  I have reviewed my discussion with the patient regarding venous insufficiency and why it causes symptoms. I have discussed with the patient the chronic skin changes that accompany venous insufficiency and the long term sequela such as ulceration. Patient will contnue wearing graduated compression stockings on a daily basis, as this has provided excellent control of his edema. The patient will put the stockings on first thing in the morning and removing them in the evening. The patient is reminded not to sleep in the stockings.  In addition, behavioral modification including elevation during the day will be initiated. Exercise is strongly encouraged.  Previous duplex ultrasound of the lower extremities shows normal deep system, no significant superficial reflux was  identified.  Given the patient's good control and lack of any problems regarding the venous insufficiency and lymphedema a lymph pump in not need at this time.    The patient will follow up with me PRN should anything change.  The patient voices agreement with this plan.   3. Mixed hyperlipidemia Continue statin as ordered and reviewed, no changes at this time     Hortencia Pilar, MD  08/03/2021 7:00 AM

## 2021-08-05 ENCOUNTER — Other Ambulatory Visit (INDEPENDENT_AMBULATORY_CARE_PROVIDER_SITE_OTHER): Payer: Commercial Managed Care - PPO

## 2021-08-05 DIAGNOSIS — E876 Hypokalemia: Secondary | ICD-10-CM | POA: Diagnosis not present

## 2021-08-05 LAB — POTASSIUM: Potassium: 3.8 mEq/L (ref 3.5–5.1)

## 2021-08-07 ENCOUNTER — Encounter (INDEPENDENT_AMBULATORY_CARE_PROVIDER_SITE_OTHER): Payer: Self-pay | Admitting: Vascular Surgery

## 2021-08-07 DIAGNOSIS — E876 Hypokalemia: Secondary | ICD-10-CM

## 2021-08-07 DIAGNOSIS — R600 Localized edema: Secondary | ICD-10-CM

## 2021-08-07 MED ORDER — POTASSIUM CHLORIDE CRYS ER 20 MEQ PO TBCR: 20.0000 meq | EXTENDED_RELEASE_TABLET | Freq: Every day | ORAL | 2 refills | Status: DC

## 2021-08-07 MED ORDER — HYDROCHLOROTHIAZIDE 25 MG PO TABS: 25.0000 mg | ORAL_TABLET | Freq: Every day | ORAL | 2 refills | Status: DC

## 2021-09-03 ENCOUNTER — Other Ambulatory Visit: Payer: Self-pay | Admitting: Primary Care

## 2021-09-03 DIAGNOSIS — E876 Hypokalemia: Secondary | ICD-10-CM

## 2021-11-03 ENCOUNTER — Ambulatory Visit (INDEPENDENT_AMBULATORY_CARE_PROVIDER_SITE_OTHER): Payer: Commercial Managed Care - PPO

## 2021-11-03 DIAGNOSIS — Z23 Encounter for immunization: Secondary | ICD-10-CM | POA: Diagnosis not present

## 2021-11-04 ENCOUNTER — Ambulatory Visit: Payer: Commercial Managed Care - PPO

## 2021-11-23 ENCOUNTER — Encounter (INDEPENDENT_AMBULATORY_CARE_PROVIDER_SITE_OTHER): Payer: Self-pay

## 2022-01-20 ENCOUNTER — Telehealth: Payer: Commercial Managed Care - PPO | Admitting: Physician Assistant

## 2022-01-20 DIAGNOSIS — U071 COVID-19: Secondary | ICD-10-CM

## 2022-01-20 MED ORDER — NIRMATRELVIR/RITONAVIR (PAXLOVID)TABLET: 3.0000 | ORAL_TABLET | Freq: Two times a day (BID) | ORAL | 0 refills | Status: AC

## 2022-01-20 NOTE — Progress Notes (Signed)
Virtual Visit Consent   Hannah Rivas, you are scheduled for a virtual visit with a [facility] provider today. Just as with appointments in the office, your consent must be obtained to participate. Your consent will be active for this visit and any virtual visit you may have with one of our providers in the next 365 days. If you have a MyChart account, a copy of this consent can be sent to you electronically.  As this is a virtual visit, video technology does not allow for your provider to perform a traditional examination. This may limit your provider's ability to fully assess your condition. If your provider identifies any concerns that need to be evaluated in person or the need to arrange testing (such as labs, EKG, etc.), we will make arrangements to do so. Although advances in technology are sophisticated, we cannot ensure that it will always work on either your end or our end. If the connection with a video visit is poor, the visit may have to be switched to a telephone visit. With either a video or telephone visit, we are not always able to ensure that we have a secure connection.  By engaging in this virtual visit, you consent to the provision of healthcare and authorize for your insurance to be billed (if applicable) for the services provided during this visit. Depending on your insurance coverage, you may receive a charge related to this service.  I need to obtain your verbal consent now. Are you willing to proceed with your visit today? Hannah Rivas has provided verbal consent on 01/20/2022 for a virtual visit (video or telephone). Leeanne Rio, [place]  Date: 01/20/2022 1:10 PM  Virtual Visit via Video Note   I, Leeanne Rio, connected with  Hannah Rivas  ([id number], August 03, 1960) on 01/20/22 at  1:00 PM EST by a video-enabled telemedicine application and verified that I am speaking with the correct person using two identifiers.  Location: Patient: Virtual Visit  Location Patient: Home Provider: Virtual Visit Location Provider: Home Office   I discussed the limitations of evaluation and management by telemedicine and the availability of in person appointments. The patient expressed understanding and agreed to proceed.    History of Present Illness: Hannah Rivas is a 61 y.o. who identifies as a female who was assigned female at birth, and is being seen today for COVID-19. Notes symptoms starting yesterday with some sore throat and congestion with chills overnight. Today with headache and fever (Tmax 103 but resolves with tylenol). COVID positive on home test  this AM. Denies chest pain or SOB.  HPI: HPI  Problems:  Patient Active Problem List   Diagnosis Date Noted   Pain and swelling of lower leg 08/03/2021   Lymphedema 08/03/2021   Chronic headaches 04/22/2021   Sore throat 03/24/2021   Nasal congestion 12/26/2020   Chest pain 04/16/2020   Hyperlipidemia 04/16/2020   Torn meniscus 11/21/2018   Sleep disturbance 11/21/2018   Encounter for annual general medical examination with abnormal findings in adult 11/15/2017   Pain of left upper extremity 10/07/2017   Extremity edema 10/07/2017    Allergies: No Known Allergies Medications:  Current Outpatient Medications:    hydrochlorothiazide (HYDRODIURIL) 25 MG tablet, Take 1 tablet (25 mg total) by mouth daily. for blood pressure., Disp: 90 tablet, Rfl: 2   Magnesium 100 MG TABS, Take by mouth., Disp: , Rfl:    Multiple Vitamin (MULTIVITAMIN) tablet, Take 1 tablet by mouth daily., Disp: , Rfl:    potassium  chloride SA (KLOR-CON M) 20 MEQ tablet, Take 1 tablet (20 mEq total) by mouth daily. For low potassium., Disp: 90 tablet, Rfl: 2  Observations/Objective: Patient is well-developed, well-nourished in no acute distress.  Resting comfortably at home.  Head is normocephalic, atraumatic.  No labored breathing. Speech is clear and coherent with logical content.  Patient is alert and oriented  at baseline.   Assessment and Plan: 1. COVID-19 - MyChart COVID-19 home monitoring program; Future  Patient with multiple risk factors for complicated course of illness. Discussed risks/benefits of antiviral medications including most common potential ADRs. Patient voiced understanding and would like to proceed with antiviral medication. They are candidate for Paxlovid. Rx sent to pharmacy. Supportive measures, OTC medications and vitamin regimen reviewed. Patient has been enrolled in a MyChart COVID symptom monitoring program. Samule Dry reviewed in detail. Strict ER precautions discussed with patient.    Follow Up Instructions: I discussed the assessment and treatment plan with the patient. The patient was provided an opportunity to ask questions and all were answered. The patient agreed with the plan and demonstrated an understanding of the instructions.  A copy of instructions were sent to the patient via MyChart unless otherwise noted below.   The patient was advised to call back or seek an in-person evaluation if the symptoms worsen or if the condition fails to improve as anticipated.  Time:  I spent 10 minutes with the patient via telehealth technology discussing the above problems/concerns.    Leeanne Rio, PA-C

## 2022-01-20 NOTE — Addendum Note (Signed)
Addended by: Brunetta Jeans on: 01/20/2022 01:15 PM   Modules accepted: Orders

## 2022-01-20 NOTE — Patient Instructions (Signed)
Vashti Hey, thank you for joining Leeanne Rio, PA-C for today's virtual visit.  While this provider is not your primary care provider (PCP), if your PCP is located in our provider database this encounter information will be shared with them immediately following your visit.   [facility] account gives you access to today's visit and all your visits, tests, and labs performed at [facility] " click here if you don't have a [place] account or go to [email or web address]  Consent: (Patient) Hannah Rivas provided verbal consent for this virtual visit at the beginning of the encounter.  Current Medications:  Current Outpatient Medications:    hydrochlorothiazide (HYDRODIURIL) 25 MG tablet, Take 1 tablet (25 mg total) by mouth daily. for blood pressure., Disp: 90 tablet, Rfl: 2   Magnesium 100 MG TABS, Take by mouth., Disp: , Rfl:    Multiple Vitamin (MULTIVITAMIN) tablet, Take 1 tablet by mouth daily., Disp: , Rfl:    potassium chloride SA (KLOR-CON M) 20 MEQ tablet, Take 1 tablet (20 mEq total) by mouth daily. For low potassium., Disp: 90 tablet, Rfl: 2   Medications ordered in this encounter:  No orders of the defined types were placed in this encounter.    *If you need refills on other medications prior to your next appointment, please contact your pharmacy*  Follow-Up: Call back or seek an in-person evaluation if the symptoms worsen or if the condition fails to improve as anticipated.  [place] [phone]  Other Instructions Please keep well-hydrated and get plenty of rest. Start a saline nasal rinse to flush out your nasal passages. You can use plain Mucinex to help thin congestion. If you have a humidifier, running in the bedroom at night. I want you to start OTC vitamin D3 1000 units daily, vitamin C 1000 mg daily, and a zinc supplement. Please take prescribed medications as directed.  You have been  enrolled in a MyChart symptom monitoring program. Please answer these questions daily so we can keep track of how you are doing.  You were to quarantine for 5 days from onset of your symptoms.  After day 5, if you have had no fever and you are feeling better, you can end quarantine but need to mask for an additional 5 days. After day 5 if you have a fever or are having significant symptoms, please quarantine for full 10 days.  If you note any worsening of symptoms, any significant shortness of breath or any chest pain, please seek ER evaluation ASAP.  Please do not delay care!  COVID-19: What to Do if You Are Sick If you test positive and are an older adult or someone who is at high risk of getting very sick from COVID-19, treatment may be available. Contact a healthcare provider right away after a positive test to determine if you are eligible, even if your symptoms are mild right now. You can also visit a Test to Treat location and, if eligible, receive a prescription from a provider. Don't delay: Treatment must be started within the first few days to be effective. If you have a fever, cough, or other symptoms, you might have COVID-19. Most people have mild illness and are able to recover at home. If you are sick: Keep track of your symptoms. If you have an emergency warning sign (including trouble breathing), call 911. Steps to help prevent the spread of COVID-19 if you are sick If you are sick with COVID-19 or think you  might have COVID-19, follow the steps below to care for yourself and to help protect other people in your home and community. Stay home except to get medical care Stay home. Most people with COVID-19 have mild illness and can recover at home without medical care. Do not leave your home, except to get medical care. Do not visit public areas and do not go to places where you are unable to wear a mask. Take care of yourself. Get rest and stay hydrated. Take over-the-counter  medicines, such as acetaminophen, to help you feel better. Stay in touch with your doctor. Call before you get medical care. Be sure to get care if you have trouble breathing, or have any other emergency warning signs, or if you think it is an emergency. Avoid public transportation, ride-sharing, or taxis if possible. Get tested If you have symptoms of COVID-19, get tested. While waiting for test results, stay away from others, including staying apart from those living in your household. Get tested as soon as possible after your symptoms start. Treatments may be available for people with COVID-19 who are at risk for becoming very sick. Don't delay: Treatment must be started early to be effective--some treatments must begin within 5 days of your first symptoms. Contact your healthcare provider right away if your test result is positive to determine if you are eligible. Self-tests are one of several options for testing for the virus that causes COVID-19 and may be more convenient than laboratory-based tests and point-of-care tests. Ask your healthcare provider or your local health department if you need help interpreting your test results. You can visit your state, tribal, local, and territorial health department's website to look for the latest local information on testing sites. Separate yourself from other people As much as possible, stay in a specific room and away from other people and pets in your home. If possible, you should use a separate bathroom. If you need to be around other people or animals in or outside of the home, wear a well-fitting mask. Tell your close contacts that they may have been exposed to COVID-19. An infected person can spread COVID-19 starting 48 hours (or 2 days) before the person has any symptoms or tests positive. By letting your close contacts know they may have been exposed to COVID-19, you are helping to protect everyone. See COVID-19 and Animals if you have questions  about pets. If you are diagnosed with COVID-19, someone from the health department may call you. Answer the call to slow the spread. Monitor your symptoms Symptoms of COVID-19 include fever, cough, or other symptoms. Follow care instructions from your healthcare provider and local health department. Your local health authorities may give instructions on checking your symptoms and reporting information. When to seek emergency medical attention Look for emergency warning signs* for COVID-19. If someone is showing any of these signs, seek emergency medical care immediately: Trouble breathing Persistent pain or pressure in the chest New confusion Inability to wake or stay awake Pale, gray, or blue-colored skin, lips, or nail beds, depending on skin tone *This list is not all possible symptoms. Please call your medical provider for any other symptoms that are severe or concerning to you. Call 911 or call ahead to your local emergency facility: Notify the operator that you are seeking care for someone who has or may have COVID-19. Call ahead before visiting your doctor Call ahead. Many medical visits for routine care are being postponed or done by phone or telemedicine. If you  have a medical appointment that cannot be postponed, call your doctor's office, and tell them you have or may have COVID-19. This will help the office protect themselves and other patients. If you are sick, wear a well-fitting mask You should wear a mask if you must be around other people or animals, including pets (even at home). Wear a mask with the best fit, protection, and comfort for you. You don't need to wear the mask if you are alone. If you can't put on a mask (because of trouble breathing, for example), cover your coughs and sneezes in some other way. Try to stay at least 6 feet away from other people. This will help protect the people around you. Masks should not be placed on young children under age 73 years, anyone  who has trouble breathing, or anyone who is not able to remove the mask without help. Cover your coughs and sneezes Cover your mouth and nose with a tissue when you cough or sneeze. Throw away used tissues in a lined trash can. Immediately wash your hands with soap and water for at least 20 seconds. If soap and water are not available, clean your hands with an alcohol-based hand sanitizer that contains at least 60% alcohol. Clean your hands often Wash your hands often with soap and water for at least 20 seconds. This is especially important after blowing your nose, coughing, or sneezing; going to the bathroom; and before eating or preparing food. Use hand sanitizer if soap and water are not available. Use an alcohol-based hand sanitizer with at least 60% alcohol, covering all surfaces of your hands and rubbing them together until they feel dry. Soap and water are the best option, especially if hands are visibly dirty. Avoid touching your eyes, nose, and mouth with unwashed hands. Handwashing Tips Avoid sharing personal household items Do not share dishes, drinking glasses, cups, eating utensils, towels, or bedding with other people in your home. Wash these items thoroughly after using them with soap and water or put in the dishwasher. Clean surfaces in your home regularly Clean and disinfect high-touch surfaces (for example, doorknobs, tables, handles, light switches, and countertops) in your "sick room" and bathroom. In shared spaces, you should clean and disinfect surfaces and items after each use by the person who is ill. If you are sick and cannot clean, a caregiver or other person should only clean and disinfect the area around you (such as your bedroom and bathroom) on an as needed basis. Your caregiver/other person should wait as long as possible (at least several hours) and wear a mask before entering, cleaning, and disinfecting shared spaces that you use. Clean and disinfect areas that may  have blood, stool, or body fluids on them. Use household cleaners and disinfectants. Clean visible dirty surfaces with household cleaners containing soap or detergent. Then, use a household disinfectant. Use a product from [employer] List N: Disinfectants for Coronavirus (UGQBV-69). Be sure to follow the instructions on the label to ensure safe and effective use of the product. Many products recommend keeping the surface wet with a disinfectant for a certain period of time (look at "contact time" on the product label). You may also need to wear personal protective equipment, such as gloves, depending on the directions on the product label. Immediately after disinfecting, wash your hands with soap and water for 20 seconds. For completed guidance on cleaning and disinfecting your home, visit Complete Disinfection Guidance. Take steps to improve ventilation at home Improve ventilation (air flow)  at home to help prevent from spreading COVID-19 to other people in your household. Clear out COVID-19 virus particles in the air by opening windows, using air filters, and turning on fans in your home. Use this interactive tool to learn how to improve air flow in your home. When you can be around others after being sick with COVID-19 Deciding when you can be around others is different for different situations. Find out when you can safely end home isolation. For any additional questions about your care, contact your healthcare provider or state or local health department. 04/15/2020 Content source: [facility] ([facility]), Division of Viral Diseases This information is not intended to replace advice given to you by your health care provider. Make sure you discuss any questions you have with your health care provider. Document Revised: 05/29/2020 Document Reviewed: 05/29/2020 Elsevier Patient Education  2022 [employer].      If you have been instructed to have an  in-person evaluation today at a local Urgent Care facility, please use the link below. It will take you to a list of all of our available [facility], including address, phone number and hours of operation. Please do not delay care.  [facility]  If you or a family member do not have a primary care provider, use the link below to schedule a visit and establish care. When you choose a [facility] primary care physician or advanced practice provider, you gain a long-term partner in health. Find a Primary Care Provider  Learn more about [facility]'s in-office and virtual care options: [facility]

## 2022-01-21 ENCOUNTER — Telehealth: Payer: Self-pay

## 2022-01-21 NOTE — Telephone Encounter (Signed)
Called, spoke with patient in regard to COVID symptoms of cough. Advised patient from my chart companion and patient verbalized understanding.  If cough remains the same or better: continue to treat with over the counter medications.  Hard candy or cough drops and drinking warm fluids. Adults can also use honey 2 tsp (10 ML) at bedtime.  If cough is becoming worse even with the use of over the counter medications and patient is not able to sleep at night, cough becomes productive with sputum that maybe yellow or green in color, contact PCP.

## 2022-01-22 DIAGNOSIS — R051 Acute cough: Secondary | ICD-10-CM

## 2022-01-29 ENCOUNTER — Ambulatory Visit: Payer: Commercial Managed Care - PPO | Admitting: Primary Care

## 2022-01-29 VITALS — BP 108/60 | HR 94 | Temp 98.3°F | Ht 67.0 in | Wt 124.8 lb

## 2022-01-29 DIAGNOSIS — U071 COVID-19: Secondary | ICD-10-CM | POA: Diagnosis not present

## 2022-01-29 HISTORY — DX: COVID-19: U07.1

## 2022-01-29 NOTE — Progress Notes (Signed)
Subjective:    Patient ID: Hannah Rivas, female    DOB: Mar 28, 1960, 62 y.o.   MRN: [id number]  Dizziness Associated symptoms include congestion, coughing and fatigue. Pertinent negatives include no chills or fever.    Hannah Rivas is a very pleasant 62 y.o. female  has a past medical history of Arthritis, Basal cell carcinoma, Breast cancer ([place]) (2005), Edema, Frequent headaches, Laryngitis (03/24/2021), and Personal history of radiation therapy (2005). who presents today to discuss dizziness.  Evaluated virtually on 01/20/22 for a 1 day history of sore throat, chills, nasal congestion. She then developed headaches and fever with temperature max of 103. She took a home Covid-19 test on 01/20/22 which was positive. She was treated with Paxlovid and other conservative treatment.   Since her Covid-19 infection she's developed a bad taste with Paxlovid and dizziness. Her taste has improved since she finished Paxlovid. She continues to notice dizziness which is sporadic lasting a few minutes. Overall her dizziness has reduced in intensity and frequency. She continues to experience head congestion, post nasal drip. Her cough has improved.   She continues to experience post nasal drip. Her appetite has slowly improved. Last week she was unable to eat much.   Review of Systems  Constitutional:  Positive for fatigue. Negative for chills and fever.  HENT:  Positive for congestion, postnasal drip and sinus pressure.   Respiratory:  Positive for cough. Negative for chest tightness and shortness of breath.   Neurological:  Positive for dizziness.         Past Medical History:  Diagnosis Date   Arthritis    bilateral thumbs   Basal cell carcinoma    Breast cancer ([place]) 2005   LEFT   Edema    lower extremity    Frequent headaches    Laryngitis 03/24/2021   Personal history of radiation therapy 2005   Left breast    Social History   Socioeconomic History   Marital status: Married     Spouse name: Not on file   Number of children: Not on file   Years of education: Not on file   Highest education level: Not on file  Occupational History   Not on file  Tobacco Use   Smoking status: Never   Smokeless tobacco: Never  Vaping Use   Vaping Use: Never used  Substance and Sexual Activity   Alcohol use: Yes    Alcohol/week: 1.0 standard drink of alcohol    Types: 1 Standard drinks or equivalent per week   Drug use: Never   Sexual activity: Not on file  Other Topics Concern   Not on file  Social History Narrative   Not on file   Social Determinants of Health   Financial Resource Strain: Not on file  Food Insecurity: Not on file  Transportation Needs: Not on file  Physical Activity: Not on file  Stress: Not on file  Social Connections: Not on file  Intimate Partner Violence: Not on file    Past Surgical History:  Procedure Laterality Date   BASAL CELL CARCINOMA EXCISION  2012, 2015   BREAST BIOPSY Left 2005   Positive   BREAST BIOPSY Right 1998   neg   BREAST LUMPECTOMY Left 2005   FOOT SURGERY Left 2018   KNEE ARTHROSCOPY Left    MANDIBLE SURGERY     not related to TMJ-orthnatic surgery to assit with migraines   PARTIAL HYSTERECTOMY  2006   REDUCTION MAMMAPLASTY Right 2005   TUBAL LIGATION  Family History  Problem Relation Age of Onset   Heart attack Mother    Stroke Father    Glaucoma Father    Hypertension Father    Breast cancer Maternal Aunt    Colon polyps Neg Hx    Colon cancer Neg Hx    Esophageal cancer Neg Hx    Rectal cancer Neg Hx    Prostate cancer Neg Hx     No Known Allergies  Current Outpatient Medications on File Prior to Visit  Medication Sig Dispense Refill   Ascorbic Acid (VITAMIN C) 1000 MG tablet Take 1,000 mg by mouth daily.     Cholecalciferol (VITAMIN D3) 250 MCG (10000 UT) capsule Take 10,000 Units by mouth daily.     hydrochlorothiazide (HYDRODIURIL) 25 MG tablet Take 1 tablet (25 mg total) by mouth  daily. for blood pressure. 90 tablet 2   Magnesium 100 MG TABS Take by mouth.     Multiple Vitamin (MULTIVITAMIN) tablet Take 1 tablet by mouth daily.     potassium chloride SA (KLOR-CON M) 20 MEQ tablet Take 1 tablet (20 mEq total) by mouth daily. For low potassium. 90 tablet 2   No current facility-administered medications on file prior to visit.    BP 108/60   Pulse 94   Temp 98.3 F (36.8 C) (Oral)   Ht '5\' 7"'$  (1.702 m)   Wt 124 lb 12.8 oz (56.6 kg)   SpO2 99%   BMI 19.55 kg/m  Objective:   Physical Exam Constitutional:      Appearance: She is not ill-appearing.  HENT:     Right Ear: Tympanic membrane and ear canal normal.     Left Ear: Tympanic membrane and ear canal normal.     Nose:     Right Sinus: No maxillary sinus tenderness or frontal sinus tenderness.     Left Sinus: No maxillary sinus tenderness or frontal sinus tenderness.     Mouth/Throat:     Pharynx: No posterior oropharyngeal erythema.  Eyes:     Conjunctiva/sclera: Conjunctivae normal.  Cardiovascular:     Rate and Rhythm: Normal rate and regular rhythm.  Pulmonary:     Effort: Pulmonary effort is normal.     Breath sounds: Normal breath sounds. No wheezing or rales.  Musculoskeletal:     Cervical back: Neck supple.  Skin:    General: Skin is warm and dry.           Assessment & Plan:  COVID-19 virus infection Assessment & Plan: [place].  Overall improving which is reassuring. Discussed that her lingering symptoms were expected as long as she gradually improves.  Exam today benign.  Increase Flonase to BID. Add antihistamine daily.  Return precautions provided.          Pleas Koch, NP

## 2022-01-29 NOTE — Patient Instructions (Signed)
Start taking an antihistamine daily such as Claritin/Allegra/Zyrtec.  Increase use of Flonase (fluticasone) nasal spray. Instill 1 spray in each nostril twice daily.   Please update me if no continued improvement in symptoms.  It was a pleasure to see you today!

## 2022-01-29 NOTE — Assessment & Plan Note (Signed)
[place].  Overall improving which is reassuring. Discussed that her lingering symptoms were expected as long as she gradually improves.  Exam today benign.  Increase Flonase to BID. Add antihistamine daily.  Return precautions provided.

## 2022-02-06 MED ORDER — AZITHROMYCIN 250 MG PO TABS: ORAL_TABLET | ORAL | 0 refills | Status: DC

## 2022-02-12 MED ORDER — PREDNISONE 20 MG PO TABS: ORAL_TABLET | ORAL | 0 refills | Status: DC

## 2022-03-11 ENCOUNTER — Other Ambulatory Visit: Payer: Self-pay | Admitting: Primary Care

## 2022-03-11 DIAGNOSIS — Z1231 Encounter for screening mammogram for malignant neoplasm of breast: Secondary | ICD-10-CM

## 2022-03-16 ENCOUNTER — Other Ambulatory Visit: Payer: Self-pay | Admitting: Primary Care

## 2022-03-16 DIAGNOSIS — R600 Localized edema: Secondary | ICD-10-CM

## 2022-04-16 ENCOUNTER — Ambulatory Visit
Admission: RE | Admit: 2022-04-16 | Discharge: 2022-04-16 | Disposition: A | Discharge status: Home / Self Care | Payer: Commercial Managed Care - PPO | Source: Ambulatory Visit | Attending: Primary Care | Admitting: Primary Care

## 2022-04-16 DIAGNOSIS — Z1231 Encounter for screening mammogram for malignant neoplasm of breast: Secondary | ICD-10-CM | POA: Insufficient documentation

## 2022-04-27 ENCOUNTER — Ambulatory Visit (INDEPENDENT_AMBULATORY_CARE_PROVIDER_SITE_OTHER): Payer: Commercial Managed Care - PPO | Admitting: Primary Care

## 2022-04-27 ENCOUNTER — Encounter: Payer: Self-pay | Admitting: Primary Care

## 2022-04-27 VITALS — BP 98/62 | HR 69 | Temp 98.0°F | Ht 67.0 in | Wt 127.0 lb

## 2022-04-27 DIAGNOSIS — E559 Vitamin D deficiency, unspecified: Secondary | ICD-10-CM | POA: Diagnosis not present

## 2022-04-27 DIAGNOSIS — R600 Localized edema: Secondary | ICD-10-CM | POA: Diagnosis not present

## 2022-04-27 DIAGNOSIS — R519 Headache, unspecified: Secondary | ICD-10-CM | POA: Diagnosis not present

## 2022-04-27 DIAGNOSIS — Z8349 Family history of other endocrine, nutritional and metabolic diseases: Secondary | ICD-10-CM | POA: Diagnosis not present

## 2022-04-27 DIAGNOSIS — E782 Mixed hyperlipidemia: Secondary | ICD-10-CM | POA: Diagnosis not present

## 2022-04-27 DIAGNOSIS — Z0000 Encounter for general adult medical examination without abnormal findings: Secondary | ICD-10-CM | POA: Diagnosis not present

## 2022-04-27 DIAGNOSIS — G8929 Other chronic pain: Secondary | ICD-10-CM

## 2022-04-27 LAB — TSH: TSH: 1.36 u[IU]/mL (ref 0.35–5.50)

## 2022-04-27 LAB — COMPREHENSIVE METABOLIC PANEL
ALT: 20 U/L (ref 0–35)
AST: 26 U/L (ref 0–37)
Albumin: 4.5 g/dL (ref 3.5–5.2)
Alkaline Phosphatase: 52 U/L (ref 39–117)
BUN: 14 mg/dL (ref 6–23)
CO2: 30 mEq/L (ref 19–32)
Calcium: 9.6 mg/dL (ref 8.4–10.5)
Chloride: 99 mEq/L (ref 96–112)
Creatinine, Ser: 0.84 mg/dL (ref 0.40–1.20)
GFR: 73.96 mL/min (ref >60.00)
Glucose, Bld: 88 mg/dL (ref 70–99)
Potassium: 4.6 mEq/L (ref 3.5–5.1)
Sodium: 135 mEq/L (ref 135–145)
Total Bilirubin: 0.5 mg/dL (ref 0.2–1.2)
Total Protein: 7 g/dL (ref 6.0–8.3)

## 2022-04-27 LAB — LIPID PANEL
Cholesterol: 206 mg/dL — ABNORMAL HIGH (ref 0–200)
HDL: 86.5 mg/dL (ref >39.00)
LDL Cholesterol: 105 mg/dL — ABNORMAL HIGH (ref 0–99)
NonHDL: 119.21
Total CHOL/HDL Ratio: 2
Triglycerides: 71 mg/dL (ref 0.0–149.0)
VLDL: 14.2 mg/dL (ref 0.0–40.0)

## 2022-04-27 LAB — T4, FREE: Free T4: 0.81 ng/dL (ref 0.60–1.60)

## 2022-04-27 LAB — T3, FREE: T3, Free: 3.4 pg/mL (ref 2.3–4.2)

## 2022-04-27 LAB — CBC
HCT: 41.2 % (ref 36.0–46.0)
Hemoglobin: 13.7 g/dL (ref 12.0–15.0)
MCHC: 33.3 g/dL (ref 30.0–36.0)
MCV: 84.5 fl (ref 78.0–100.0)
Platelets: 192 10*3/uL (ref 150.0–400.0)
RBC: 4.88 Mil/uL (ref 3.87–5.11)
RDW: 12.8 % (ref 11.5–15.5)
WBC: 5.6 10*3/uL (ref 4.0–10.5)

## 2022-04-27 LAB — VITAMIN D 25 HYDROXY (VIT D DEFICIENCY, FRACTURES): VITD: 67.57 ng/mL (ref 30.00–100.00)

## 2022-04-27 NOTE — Assessment & Plan Note (Signed)
Continue vitamin D 10,000 IU daily. Repeat vitamin D level pending.

## 2022-04-27 NOTE — Assessment & Plan Note (Addendum)
Controlled.  Continue HCTZ 25 mg daily and potassium 20 mEq daily. CMP pending.

## 2022-04-27 NOTE — Progress Notes (Signed)
Subjective:    Patient ID: Hannah Rivas, female    DOB: 1960/11/27, 62 y.o.   MRN: [id number]  HPI  Hannah Rivas is a very pleasant 62 y.o. female who presents today for complete physical and follow up of chronic conditions.   Immunizations: -Tetanus: Completed in 2020 -Influenza: Completed this season -Shingles: Completed Zostavax  Diet: Fair diet.  Exercise: Exercises at the gym  Eye exam: Completes annually  Dental exam: Completes semi-annually    Pap Smear: Hysterectomy  Mammogram: March 2024  Colonoscopy: Completed in 2022, due 2029   BP Readings from Last 3 Encounters:  04/27/22 98/62  01/29/22 108/60  08/03/21 106/70        Review of Systems  Constitutional:  Negative for unexpected weight change.  HENT:  Negative for rhinorrhea.   Respiratory:  Negative for cough and shortness of breath.   Cardiovascular:  Positive for leg swelling. Negative for chest pain.  Gastrointestinal:  Negative for constipation and diarrhea.  Genitourinary:  Negative for difficulty urinating and menstrual problem.  Musculoskeletal:  Positive for arthralgias.  Skin:  Negative for rash.  Allergic/Immunologic: Negative for environmental allergies.  Neurological:  Negative for dizziness, numbness and headaches.  Psychiatric/Behavioral:  Positive for sleep disturbance. The patient is not nervous/anxious.          Past Medical History:  Diagnosis Date   Arthritis    bilateral thumbs   Basal cell carcinoma    Breast cancer 2005   LEFT   COVID-19 virus infection 01/29/2022   Edema    lower extremity    Frequent headaches    Laryngitis 03/24/2021   Personal history of radiation therapy 2005   Left breast    Social History   Socioeconomic History   Marital status: Married    Spouse name: Not on file   Number of children: Not on file   Years of education: Not on file   Highest education level: Not on file  Occupational History   Not on file  Tobacco Use    Smoking status: Never   Smokeless tobacco: Never  Vaping Use   Vaping Use: Never used  Substance and Sexual Activity   Alcohol use: Yes    Alcohol/week: 1.0 standard drink of alcohol    Types: 1 Standard drinks or equivalent per week   Drug use: Never   Sexual activity: Not on file  Other Topics Concern   Not on file  Social History Narrative   Not on file   Social Determinants of Health   Financial Resource Strain: Not on file  Food Insecurity: Not on file  Transportation Needs: Not on file  Physical Activity: Not on file  Stress: Not on file  Social Connections: Not on file  Intimate Partner Violence: Not on file    Past Surgical History:  Procedure Laterality Date   ABDOMINAL HYSTERECTOMY  2006   partial   BASAL CELL CARCINOMA EXCISION  2012, 2015   BREAST BIOPSY Left 2005   Positive   BREAST BIOPSY Right 1998   neg   BREAST LUMPECTOMY Left 2005   COSMETIC SURGERY  2012   right breast reduction   EYE SURGERY  2009   lasik   FOOT SURGERY Left 2018   KNEE ARTHROSCOPY Left    MANDIBLE SURGERY     not related to TMJ-orthnatic surgery to assit with migraines   PARTIAL HYSTERECTOMY  2006   REDUCTION MAMMAPLASTY Right 2005   TUBAL LIGATION      Family  History  Problem Relation Age of Onset   Heart attack Mother    Stroke Father    Glaucoma Father    Hypertension Father    Breast cancer Maternal Aunt    Early death Sister    Stroke Sister    Stroke Brother    Stroke Brother    Colon polyps Neg Hx    Colon cancer Neg Hx    Esophageal cancer Neg Hx    Rectal cancer Neg Hx    Prostate cancer Neg Hx     No Known Allergies  Current Outpatient Medications on File Prior to Visit  Medication Sig Dispense Refill   Ascorbic Acid (VITAMIN C) 1000 MG tablet Take 1,000 mg by mouth daily.     Cholecalciferol (VITAMIN D3) 250 MCG (10000 UT) capsule Take 10,000 Units by mouth daily.     hydrochlorothiazide (HYDRODIURIL) 25 MG tablet Take 1 tablet (25 mg total) by  mouth daily. for blood pressure. 90 tablet 2   Magnesium 100 MG TABS Take by mouth.     Multiple Vitamin (MULTIVITAMIN) tablet Take 1 tablet by mouth daily.     potassium chloride SA (KLOR-CON M) 20 MEQ tablet Take 1 tablet (20 mEq total) by mouth daily. For low potassium. 90 tablet 2   No current facility-administered medications on file prior to visit.    BP 98/62   Pulse 69   Temp 98 F (36.7 C) (Temporal)   Ht 5\' 7"  (1.702 m)   Wt 127 lb (57.6 kg)   SpO2 100%   BMI 19.89 kg/m  Objective:   Physical Exam HENT:     Right Ear: Tympanic membrane and ear canal normal.     Left Ear: Tympanic membrane and ear canal normal.     Nose: Nose normal.  Eyes:     Conjunctiva/sclera: Conjunctivae normal.     Pupils: Pupils are equal, round, and reactive to light.  Neck:     Thyroid: No thyromegaly.  Cardiovascular:     Rate and Rhythm: Normal rate and regular rhythm.     Heart sounds: No murmur heard. Pulmonary:     Effort: Pulmonary effort is normal.     Breath sounds: Normal breath sounds. No rales.  Abdominal:     General: Bowel sounds are normal.     Palpations: Abdomen is soft.     Tenderness: There is no abdominal tenderness.  Musculoskeletal:        General: Normal range of motion.     Cervical back: Neck supple.  Lymphadenopathy:     Cervical: No cervical adenopathy.  Skin:    General: Skin is warm and dry.     Findings: No rash.  Neurological:     Mental Status: She is alert and oriented to person, place, and time.     Cranial Nerves: No cranial nerve deficit.     Deep Tendon Reflexes: Reflexes are normal and symmetric.  Psychiatric:        Mood and Affect: Mood normal.           Assessment & Plan:  Preventative health care Assessment & Plan: Declines Shingrix vaccines. Pap smear UTD. Mammogram UTD. Colonoscopy UTD, due 2029  Discussed the importance of a healthy diet and regular exercise in order for weight loss, and to reduce the risk of further  co-morbidity.  Exam stable. Labs pending.  Follow up in 1 year for repeat physical.    Chronic nonintractable headache, unspecified headache type Assessment & Plan: Controlled.  No concerns today. Continue to monitor.    Extremity edema Assessment & Plan: Controlled.  Continue HCTZ 25 mg daily and potassium 20 mEq daily. CMP pending.   Mixed hyperlipidemia Assessment & Plan: Repeat lipid panel pending.  Commended her on regular exercise.   Orders: -     Lipid panel -     Comprehensive metabolic panel -     CBC  Family history of thyroid disease Assessment & Plan: Thyroid studies ordered and pending.  Orders: -     TSH -     T4, free -     T3, free  Vitamin D deficiency Assessment & Plan: Continue vitamin D 10,000 IU daily. Repeat vitamin D level pending.  Orders: -     VITAMIN D 25 Hydroxy (Vit-D Deficiency, Fractures)        Pleas Koch, NP

## 2022-04-27 NOTE — Assessment & Plan Note (Signed)
Declines Shingrix vaccines. Pap smear UTD. Mammogram UTD. Colonoscopy UTD, due 2029  Discussed the importance of a healthy diet and regular exercise in order for weight loss, and to reduce the risk of further co-morbidity.  Exam stable. Labs pending.  Follow up in 1 year for repeat physical.

## 2022-04-27 NOTE — Assessment & Plan Note (Signed)
Thyroid studies ordered and pending.

## 2022-04-27 NOTE — Patient Instructions (Signed)
Stop by the lab prior to leaving today. I will notify you of your results once received.   It was a pleasure to see you today!  

## 2022-04-27 NOTE — Assessment & Plan Note (Signed)
Repeat lipid panel pending.   Commended her on regular exercise.  

## 2022-04-27 NOTE — Assessment & Plan Note (Signed)
Controlled.  No concerns today. Continue to monitor.  

## 2022-06-15 ENCOUNTER — Ambulatory Visit (INDEPENDENT_AMBULATORY_CARE_PROVIDER_SITE_OTHER): Payer: Commercial Managed Care - PPO

## 2022-06-15 ENCOUNTER — Ambulatory Visit: Payer: Commercial Managed Care - PPO | Admitting: Podiatry

## 2022-06-15 DIAGNOSIS — M2012 Hallux valgus (acquired), left foot: Secondary | ICD-10-CM

## 2022-06-15 DIAGNOSIS — B351 Tinea unguium: Secondary | ICD-10-CM | POA: Diagnosis not present

## 2022-06-15 DIAGNOSIS — M79674 Pain in right toe(s): Secondary | ICD-10-CM | POA: Diagnosis not present

## 2022-06-15 DIAGNOSIS — T8484XA Pain due to internal orthopedic prosthetic devices, implants and grafts, initial encounter: Secondary | ICD-10-CM

## 2022-06-15 DIAGNOSIS — M79675 Pain in left toe(s): Secondary | ICD-10-CM | POA: Diagnosis not present

## 2022-06-15 DIAGNOSIS — D2371 Other benign neoplasm of skin of right lower limb, including hip: Secondary | ICD-10-CM | POA: Diagnosis not present

## 2022-06-15 NOTE — Progress Notes (Signed)
Chief Complaint  Patient presents with   Plantar Warts    Right foot possible plantar wart causing some discomfort concerned about nail fungus     Subjective: 62 y.o. female presenting to the office today for evaluation of a symptomatic skin lesion to the right foot.  She says that is causing discomfort.  Idiopathic onset.  She has not done anything for treatment. Patient also has concern for thickened discolored toenails bilateral.  She says that she is unable to trim her own toenails.    Patient also states that she does have a history of bunion surgery to the right foot about 5 years ago by another [employer] adviser [place].  She was very unsatisfied with the results and would like to have it evaluated.  Presenting for further treatment and evaluation   Past Medical History:  Diagnosis Date   Arthritis    bilateral thumbs   Basal cell carcinoma    Breast cancer (HCC) 2005   LEFT   COVID-19 virus infection 01/29/2022   Edema    lower extremity    Frequent headaches    Laryngitis 03/24/2021   Personal history of radiation therapy 2005   Left breast    Past Surgical History:  Procedure Laterality Date   ABDOMINAL HYSTERECTOMY  2006   partial   BASAL CELL CARCINOMA EXCISION  2012, 2015   BREAST BIOPSY Left 2005   Positive   BREAST BIOPSY Right 1998   neg   BREAST LUMPECTOMY Left 2005   COSMETIC SURGERY  2012   right breast reduction   EYE SURGERY  2009   lasik   FOOT SURGERY Left 2018   KNEE ARTHROSCOPY Left    MANDIBLE SURGERY     not related to TMJ-orthnatic surgery to assit with migraines   PARTIAL HYSTERECTOMY  2006   REDUCTION MAMMAPLASTY Right 2005   TUBAL LIGATION      No Known Allergies   Objective:  Physical Exam General: Alert and oriented x3 in no acute distress  Dermatology: Hyperkeratotic lesion(s) present on the right foot with associated tenderness.. Pain on palpation with a central nucleated core noted. Skin is warm, dry and supple bilateral  lower extremities. Negative for open lesions or macerations.  Hyperkeratotic elongated dystrophic nails also noted 1-5 bilateral  Vascular: Palpable pedal pulses bilaterally. No edema or erythema noted. Capillary refill within normal limits.  Neurological: Epicritic and protective threshold grossly intact bilaterally.   Musculoskeletal Exam: Pain on palpation at the keratotic lesion(s) noted. Range of motion within normal limits bilateral. Muscle strength 5/5 in all groups bilateral.  Clinical evidence of hallux valgus deformity also noted to the right foot with lateral deviation of the great toe.  Radiographic exam RT foot 06/15/2022: Orthopedic hardware noted from prior Lapidus type bunionectomy surgery to the first TMT.  It appears stable and intact.  Osseous consolidation noted throughout the first TMT.  There continues to be some increased intermetatarsal space with shortening of the first metatarsal.  Lateral deviation of the great toe with an increased hallux abductus angle.  Assessment: 1.  Symptomatic benign skin lesion 2.  Pain due to onychomycosis of toenails both 3.  History of Lapidus bunionectomy right; about 5 years ago   Plan of Care:  1. Patient evaluated 2. Excisional debridement of keratoic lesion(s) using a chisel blade was performed without incident.  Salicylic acid applied. 3.  Mechanical debridement of nails 1-5 bilateral was performed using nail nipper without incident or bleeding 4.  Today  we discussed the patient's x-rays and her prior bunion surgery.  For now I would recommend conservative treatment.  She states that the hardware is somewhat painful and rubs in her shoes.  She can feel it especially with certain exercises and certain shoes.  There has been complete osseous consolidation of the surgical site and removal of hardware is definitely an option to help alleviate some of the hardware pain.  Patient would like to think about this option and if she would like to  go ahead and have the hardware removed she would like to do it towards the end of the year  5.  Return to the clinic PRN.   Felecia Shelling, DPM [facility]  Dr. Felecia Shelling, DPM    [address] [place], [place] [address]                Office [phone]  Fax [phone]

## 2022-08-24 DIAGNOSIS — U071 COVID-19: Secondary | ICD-10-CM

## 2022-08-24 DIAGNOSIS — Z87898 Personal history of other specified conditions: Secondary | ICD-10-CM

## 2022-08-25 ENCOUNTER — Ambulatory Visit: Payer: Commercial Managed Care - PPO | Admitting: Family Medicine

## 2022-08-25 ENCOUNTER — Ambulatory Visit
Admission: EM | Admit: 2022-08-25 | Discharge: 2022-08-25 | Disposition: A | Discharge status: Home / Self Care | Payer: Commercial Managed Care - PPO | Attending: Family Medicine | Admitting: Family Medicine

## 2022-08-25 DIAGNOSIS — J0100 Acute maxillary sinusitis, unspecified: Secondary | ICD-10-CM | POA: Insufficient documentation

## 2022-08-25 DIAGNOSIS — Z20822 Contact with and (suspected) exposure to covid-19: Secondary | ICD-10-CM | POA: Insufficient documentation

## 2022-08-25 MED ORDER — AZITHROMYCIN 250 MG PO TABS: ORAL_TABLET | ORAL | 0 refills | Status: DC

## 2022-08-25 NOTE — Telephone Encounter (Signed)
Noted  

## 2022-08-25 NOTE — Telephone Encounter (Addendum)
I spoke with pt and she said she does not need anything now pt was seen [facility] and should get covid test results on 08/26/22. Sending note to Allayne Gitelman NP as Lorain Childes,

## 2022-08-25 NOTE — ED Provider Notes (Signed)
Renaldo Fiddler    CSN: [id number] Arrival date & time: 08/25/22  1116      History   Chief Complaint Chief Complaint  Patient presents with   Nasal Congestion    HPI Hannah Rivas is a 62 y.o. female.   HPI Patient presents with concern for possible sinus infection, with an onset of symptoms 4-5 days ago. She endorses nasal congestion, post nasal drainage, and congestion with bilateral ear fullness. Patient has chronic allergies and take Flonase and Claritin daily. Since the onset of symptoms she has not had fever. Has a mild cough. She is also taking Mucinex. She has had a recent exposure to COVID as her husband is positive for COVID. She took a COVID test next yesterday which was negative. She denies shortness of breath, weakness, or bodyaches.    Remainder of Review of Systems negative except as noted in the HPI.    Past Medical History:  Diagnosis Date   Arthritis    bilateral thumbs   Basal cell carcinoma    Breast cancer (HCC) 2005   LEFT   COVID-19 virus infection 01/29/2022   Edema    lower extremity    Frequent headaches    Laryngitis 03/24/2021   Personal history of radiation therapy 2005   Left breast    Patient Active Problem List   Diagnosis Date Noted   Family history of thyroid disease 04/27/2022   Vitamin D deficiency 04/27/2022   Lymphedema 08/03/2021   Chronic headaches 04/22/2021   Chest pain 04/16/2020   Hyperlipidemia 04/16/2020   Torn meniscus 11/21/2018   Sleep disturbance 11/21/2018   Preventative health care 11/15/2017   Pain of left upper extremity 10/07/2017   Extremity edema 10/07/2017    Past Surgical History:  Procedure Laterality Date   ABDOMINAL HYSTERECTOMY  2006   partial   BASAL CELL CARCINOMA EXCISION  2012, 2015   BREAST BIOPSY Left 2005   Positive   BREAST BIOPSY Right 1998   neg   BREAST LUMPECTOMY Left 2005   COSMETIC SURGERY  2012   right breast reduction   EYE SURGERY  2009   lasik   FOOT SURGERY  Left 2018   KNEE ARTHROSCOPY Left    MANDIBLE SURGERY     not related to TMJ-orthnatic surgery to assit with migraines   PARTIAL HYSTERECTOMY  2006   REDUCTION MAMMAPLASTY Right 2005   TUBAL LIGATION      OB History   No obstetric history on file.      Home Medications    Prior to Admission medications   Medication Sig Start Date End Date Taking? Authorizing Provider  azithromycin (ZITHROMAX) 250 MG tablet Take 2 tabs PO x 1 dose, then 1 tab PO QD x 4 days 08/25/22  Yes Bing Neighbors, NP  Ascorbic Acid (VITAMIN C) 1000 MG tablet Take 1,000 mg by mouth daily.    [provider]  Cholecalciferol (VITAMIN D3) 250 MCG (10000 UT) capsule Take 10,000 Units by mouth daily.    [provider]  hydrochlorothiazide (HYDRODIURIL) 25 MG tablet Take 1 tablet (25 mg total) by mouth daily. for blood pressure. 08/07/21   Doreene Nest, NP  Magnesium 100 MG TABS Take by mouth.    [provider]  Multiple Vitamin (MULTIVITAMIN) tablet Take 1 tablet by mouth daily.    [provider]  potassium chloride SA (KLOR-CON M) 20 MEQ tablet Take 1 tablet (20 mEq total) by mouth daily. For low potassium. 08/07/21  Doreene Nest, NP    Family History Family History  Problem Relation Age of Onset   Heart attack Mother    Stroke Father    Glaucoma Father    Hypertension Father    Breast cancer Maternal Aunt    Early death Sister    Stroke Sister    Stroke Brother    Stroke Brother    Colon polyps Neg Hx    Colon cancer Neg Hx    Esophageal cancer Neg Hx    Rectal cancer Neg Hx    Prostate cancer Neg Hx     Social History Social History   Tobacco Use   Smoking status: Never   Smokeless tobacco: Never  Vaping Use   Vaping status: Never Used  Substance Use Topics   Alcohol use: Yes    Alcohol/week: 1.0 standard drink of alcohol    Types: 1 Standard drinks or equivalent per week   Drug use: Never     Allergies   Patient has no known  allergies.   Review of Systems Review of Systems Pertinent negatives listed in HPI   Physical Exam Triage Vital Signs ED Triage Vitals  Encounter Vitals Group     BP 08/25/22 1142 114/74     Systolic BP Percentile --      Diastolic BP Percentile --      Pulse Rate 08/25/22 1142 100     Resp 08/25/22 1142 19     Temp 08/25/22 1142 98.5 F (36.9 C)     Temp src --      SpO2 08/25/22 1142 98 %     Weight --      Height --      Head Circumference --      Peak Flow --      Pain Score 08/25/22 1140 8     Pain Loc --      Pain Education --      Exclude from Growth Chart --    No data found.  Updated Vital Signs BP 114/74   Pulse 100   Temp 98.5 F (36.9 C)   Resp 19   SpO2 98%   Visual Acuity Right Eye Distance:   Left Eye Distance:   Bilateral Distance:    Right Eye Near:   Left Eye Near:    Bilateral Near:     Physical Exam Constitutional:      Appearance: Normal appearance.  HENT:     Head: Normocephalic.     Right Ear: Hearing, tympanic membrane, ear canal and external ear normal.     Left Ear: A middle ear effusion is present. Tympanic membrane is not erythematous, retracted or bulging. Tympanic membrane has normal mobility.     Nose: Mucosal edema, congestion and rhinorrhea present.     Right Turbinates: Enlarged.     Left Turbinates: Enlarged.     Mouth/Throat:     Lips: Pink.     Mouth: Mucous membranes are moist. No oral lesions or angioedema.     Pharynx: Oropharynx is clear. Uvula midline.  Cardiovascular:     Rate and Rhythm: Normal rate and regular rhythm.  Pulmonary:     Effort: Pulmonary effort is normal.     Breath sounds: Normal breath sounds.  Musculoskeletal:     Cervical back: Normal range of motion and neck supple.  Lymphadenopathy:     Cervical: No cervical adenopathy.  Skin:    General: Skin is warm and dry.  Neurological:  General: No focal deficit present.     Mental Status: She is alert.      UC Treatments / Results   Labs (all labs ordered are listed, but only abnormal results are displayed) Labs Reviewed - No data to display  EKG   Radiology No results found.  Procedures Procedures (including critical care time)  Medications Ordered in UC Medications - No data to display  Initial Impression / Assessment and Plan / UC Course  I have reviewed the triage vital signs and the nursing notes.  Pertinent labs & imaging results that were available during my care of the patient were reviewed by me and considered in my medical decision making (see chart for details).     Repeat COVID test and recent exposure.  Given patient's history of chronic seasonal allergies and poor relief with OTC medication will cover with azithromycin for treatment of a possible sinus infection.  Patient advised that our office will only contact her if her COVID test is positive however all results will update to MyChart.  Continue supportive treatment.  Return precautions given. Final Clinical Impressions(s) / UC Diagnoses   Final diagnoses:  Encounter for laboratory testing for COVID-19 virus  Acute non-recurrent maxillary sinusitis     Discharge Instructions      COVID-19 test is pending will result within 24 hours. Our office will contact you only if your test is positive. I am covering your for sinusitis with Azithromycin. Continue Mucinex, Claritin, and Flonase        ED Prescriptions     Medication Sig Dispense Auth. Provider   azithromycin (ZITHROMAX) 250 MG tablet Take 2 tabs PO x 1 dose, then 1 tab PO QD x 4 days 6 tablet Bing Neighbors, NP      PDMP not reviewed this encounter.   Bing Neighbors, NP 08/25/22 1234

## 2022-08-25 NOTE — ED Triage Notes (Signed)
Patient to Urgent Care with complaints of head/ nasal fullness and congestion. Has noticed some green mucus production. Concerned about a sinus infection.   Symptoms started four days ago. Negative home Covid test yesterday. Reports husband currently Covid positive.  Denies any fevers.

## 2022-08-25 NOTE — Discharge Instructions (Addendum)
COVID-19 test is pending will result within 24 hours. Our office will contact you only if your test is positive. I am covering your for sinusitis with Azithromycin. Continue Mucinex, Claritin, and Flonase

## 2022-08-27 ENCOUNTER — Ambulatory Visit: Payer: Commercial Managed Care - PPO | Admitting: Internal Medicine

## 2022-08-27 ENCOUNTER — Encounter: Payer: Self-pay | Admitting: Internal Medicine

## 2022-08-27 VITALS — BP 102/70 | HR 97 | Temp 97.8°F | Ht 67.0 in | Wt 124.0 lb

## 2022-08-27 DIAGNOSIS — U071 COVID-19: Secondary | ICD-10-CM

## 2022-08-27 DIAGNOSIS — R0981 Nasal congestion: Secondary | ICD-10-CM | POA: Insufficient documentation

## 2022-08-27 NOTE — Assessment & Plan Note (Signed)
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

## 2022-08-27 NOTE — Assessment & Plan Note (Addendum)
On z-pak Can increase flonase to 2 bid after neti pot On claritin Would consider augmentin if ongoing symptoms Persistent issues with last COVID---consider ENT

## 2022-08-27 NOTE — Progress Notes (Signed)
Subjective:    Patient ID: Hannah Rivas, female    DOB: 03/04/1960, 62 y.o.   MRN: [id number]  HPI Here due to COVID infection  Started with illness 5-6 days ago Husband tested positive 3 days ago-- but her test was negative that day She went to urgent care  Feels like sinus infection--has had a "ton" of those Frontal headache and pressure Started z-pak and had lots of drainage for one day--has felt better since then (and had cough then) No fever, chills or sweats No SOB  Uses neti pot every morning--then flonase Claritin daily  Current Outpatient Medications on File Prior to Visit  Medication Sig Dispense Refill   azithromycin (ZITHROMAX) 250 MG tablet Take 2 tabs PO x 1 dose, then 1 tab PO QD x 4 days 6 tablet 0   Cholecalciferol (VITAMIN D3) 250 MCG (10000 UT) capsule Take 10,000 Units by mouth daily.     hydrochlorothiazide (HYDRODIURIL) 25 MG tablet Take 1 tablet (25 mg total) by mouth daily. for blood pressure. 90 tablet 2   Magnesium 100 MG TABS Take by mouth.     Multiple Vitamin (MULTIVITAMIN) tablet Take 1 tablet by mouth daily.     potassium chloride SA (KLOR-CON M) 20 MEQ tablet Take 1 tablet (20 mEq total) by mouth daily. For low potassium. 90 tablet 2   No current facility-administered medications on file prior to visit.    No Known Allergies  Past Medical History:  Diagnosis Date   Arthritis    bilateral thumbs   Basal cell carcinoma    Breast cancer (HCC) 2005   LEFT   COVID-19 virus infection 01/29/2022   Edema    lower extremity    Frequent headaches    Laryngitis 03/24/2021   Personal history of radiation therapy 2005   Left breast    Past Surgical History:  Procedure Laterality Date   ABDOMINAL HYSTERECTOMY  2006   partial   BASAL CELL CARCINOMA EXCISION  2012, 2015   BREAST BIOPSY Left 2005   Positive   BREAST BIOPSY Right 1998   neg   BREAST LUMPECTOMY Left 2005   COSMETIC SURGERY  2012   right breast reduction   EYE SURGERY   2009   lasik   FOOT SURGERY Left 2018   KNEE ARTHROSCOPY Left    MANDIBLE SURGERY     not related to TMJ-orthnatic surgery to assit with migraines   PARTIAL HYSTERECTOMY  2006   REDUCTION MAMMAPLASTY Right 2005   TUBAL LIGATION      Family History  Problem Relation Age of Onset   Heart attack Mother    Stroke Father    Glaucoma Father    Hypertension Father    Breast cancer Maternal Aunt    Early death Sister    Stroke Sister    Stroke Brother    Stroke Brother    Colon polyps Neg Hx    Colon cancer Neg Hx    Esophageal cancer Neg Hx    Rectal cancer Neg Hx    Prostate cancer Neg Hx     Social History   Socioeconomic History   Marital status: Married    Spouse name: Not on file   Number of children: Not on file   Years of education: Not on file   Highest education level: Not on file  Occupational History   Not on file  Tobacco Use   Smoking status: Never   Smokeless tobacco: Never  Vaping Use  Vaping status: Never Used  Substance and Sexual Activity   Alcohol use: Yes    Alcohol/week: 1.0 standard drink of alcohol    Types: 1 Standard drinks or equivalent per week   Drug use: Never   Sexual activity: Not on file  Other Topics Concern   Not on file  Social History Narrative   Not on file   Social Determinants of Health   Financial Resource Strain: Not on file  Food Insecurity: Not on file  Transportation Needs: Not on file  Physical Activity: Not on file  Stress: Not on file  Social Connections: Not on file  Intimate Partner Violence: Not on file   Review of Systems No loss of smell or taste Slight nausea but no vomiting  Appetite is off--able to eat     Objective:   Physical Exam Constitutional:      Appearance: Normal appearance.  HENT:     Head:     Comments: No sinus tenderness    Right Ear: Tympanic membrane and ear canal normal.     Left Ear: Tympanic membrane and ear canal normal.     Mouth/Throat:     Pharynx: No oropharyngeal  exudate or posterior oropharyngeal erythema.  Pulmonary:     Effort: Pulmonary effort is normal.     Breath sounds: Normal breath sounds. No wheezing or rales.  Musculoskeletal:     Cervical back: Neck supple.  Lymphadenopathy:     Cervical: No cervical adenopathy.  Neurological:     Mental Status: She is alert.            Assessment & Plan:

## 2022-09-09 MED ORDER — PREDNISONE 20 MG PO TABS: ORAL_TABLET | ORAL | 0 refills | Status: DC

## 2022-09-09 MED ORDER — MECLIZINE HCL 12.5 MG PO TABS: 12.5000 mg | ORAL_TABLET | Freq: Three times a day (TID) | ORAL | 0 refills | Status: DC | PRN

## 2022-09-13 DIAGNOSIS — R0981 Nasal congestion: Secondary | ICD-10-CM

## 2022-09-14 ENCOUNTER — Ambulatory Visit: Payer: Commercial Managed Care - PPO | Admitting: Primary Care

## 2022-09-14 VITALS — BP 102/70 | HR 75 | Temp 97.5°F | Ht 67.0 in | Wt 129.0 lb

## 2022-09-14 DIAGNOSIS — R42 Dizziness and giddiness: Secondary | ICD-10-CM | POA: Diagnosis not present

## 2022-09-14 DIAGNOSIS — R600 Localized edema: Secondary | ICD-10-CM

## 2022-09-14 MED ORDER — HYDROCHLOROTHIAZIDE 25 MG PO TABS: 25.0000 mg | ORAL_TABLET | Freq: Every day | ORAL | 1 refills | Status: DC

## 2022-09-14 NOTE — Assessment & Plan Note (Addendum)
Likely inner ear cause.  Neuroexam today negative. No obvious infection. Blood pressure today is low normal, she does not prefer to reduce her hydrochlorothiazide dose as it helps a lot with lower extremity edema.  She appears well today, not sickly.  Start meclizine 12.5 mg 3 times daily as needed. Start Epley maneuvers at home.  Continue Claritin and Flonase.  She will update in a few days.

## 2022-09-14 NOTE — Progress Notes (Signed)
Subjective:    Patient ID: Hannah Rivas, female    DOB: 12-15-1960, 62 y.o.   MRN: [id number]  HPI  Hannah Rivas is a very pleasant 62 y.o. female with a history of COVID-19 infection, chronic headaches, lymphedema who presents today for follow-up of COVID and continued cough.  Evaluated urgent care in [place] on 08/25/2022 for a 4 to 5-day history of nasal congestion, postnasal drip, bilateral ear fullness.  COVID-19 to [place] was positive.  Her husband had COVID-19 infection just prior to her symptom onset.  During her urgent care visit she was treated with azithromycin course.  She contacted [place] last week with continued symptoms of head congestion and dizziness.  Given the symptoms prednisone course was provided and sent to her pharmacy.  Today she's feeling "worse" in terms of her dizziness. She's noticing intermittent post nasal drip with cough, left ear popping.  Overall she is feeling better in terms of her COVID-19 infection.  Her dizziness occurs with rest and movement. She was walking yesterday outdoors, felt off balance.   She denies sinus pressure, fevers, nasal congestion.  She is compliant to her Flonase and Claritin daily.  She has not tried the meclizine.  She does have a history of BPPV, has not performed her Epley maneuvers at home yet.   Review of Systems  Constitutional:  Negative for chills and fever.  HENT:  Positive for postnasal drip. Negative for congestion, sinus pressure, sinus pain and sore throat.   Respiratory:  Positive for cough. Negative for shortness of breath.   Neurological:  Positive for light-headedness.         Past Medical History:  Diagnosis Date   Arthritis    bilateral thumbs   Basal cell carcinoma    Breast cancer (HCC) 2005   LEFT   COVID-19 virus infection 01/29/2022   Edema    lower extremity    Frequent headaches    Laryngitis 03/24/2021   Personal history of radiation therapy 2005   Left breast    Social History    Socioeconomic History   Marital status: Married    Spouse name: Not on file   Number of children: Not on file   Years of education: Not on file   Highest education level: Not on file  Occupational History   Not on file  Tobacco Use   Smoking status: Never   Smokeless tobacco: Never  Vaping Use   Vaping status: Never Used  Substance and Sexual Activity   Alcohol use: Yes    Alcohol/week: 1.0 standard drink of alcohol    Types: 1 Standard drinks or equivalent per week   Drug use: Never   Sexual activity: Not on file  Other Topics Concern   Not on file  Social History Narrative   Not on file   Social Determinants of Health   Financial Resource Strain: Not on file  Food Insecurity: Not on file  Transportation Needs: Not on file  Physical Activity: Not on file  Stress: Not on file  Social Connections: Not on file  Intimate Partner Violence: Not on file    Past Surgical History:  Procedure Laterality Date   ABDOMINAL HYSTERECTOMY  2006   partial   BASAL CELL CARCINOMA EXCISION  2012, 2015   BREAST BIOPSY Left 2005   Positive   BREAST BIOPSY Right 1998   neg   BREAST LUMPECTOMY Left 2005   COSMETIC SURGERY  2012   right breast reduction   EYE SURGERY  2009   lasik   FOOT SURGERY Left 2018   KNEE ARTHROSCOPY Left    MANDIBLE SURGERY     not related to TMJ-orthnatic surgery to assit with migraines   PARTIAL HYSTERECTOMY  2006   REDUCTION MAMMAPLASTY Right 2005   TUBAL LIGATION      Family History  Problem Relation Age of Onset   Heart attack Mother    Stroke Father    Glaucoma Father    Hypertension Father    Breast cancer Maternal Aunt    Early death Sister    Stroke Sister    Stroke Brother    Stroke Brother    Colon polyps Neg Hx    Colon cancer Neg Hx    Esophageal cancer Neg Hx    Rectal cancer Neg Hx    Prostate cancer Neg Hx     No Known Allergies  Current Outpatient Medications on File Prior to Visit  Medication Sig Dispense Refill    Cholecalciferol (VITAMIN D3) 250 MCG (10000 UT) capsule Take 10,000 Units by mouth daily.     hydrochlorothiazide (HYDRODIURIL) 25 MG tablet Take 1 tablet (25 mg total) by mouth daily. 90 tablet 1   Magnesium 100 MG TABS Take by mouth.     meclizine (ANTIVERT) 12.5 MG tablet Take 1 tablet (12.5 mg total) by mouth 3 (three) times daily as needed for dizziness. 30 tablet 0   Multiple Vitamin (MULTIVITAMIN) tablet Take 1 tablet by mouth daily.     potassium chloride SA (KLOR-CON M) 20 MEQ tablet Take 1 tablet (20 mEq total) by mouth daily. For low potassium. 90 tablet 2   No current facility-administered medications on file prior to visit.    BP 102/70   Pulse 75   Temp (!) 97.5 F (36.4 C) (Temporal)   Ht 5\' 7"  (1.702 m)   Wt 129 lb (58.5 kg)   SpO2 99%   BMI 20.20 kg/m  Objective:   Physical Exam HENT:     Right Ear: Tympanic membrane and ear canal normal.     Left Ear: Tympanic membrane and ear canal normal.     Nose:     Right Sinus: No maxillary sinus tenderness or frontal sinus tenderness.     Left Sinus: No maxillary sinus tenderness or frontal sinus tenderness.     Mouth/Throat:     Pharynx: No posterior oropharyngeal erythema.  Eyes:     Extraocular Movements: Extraocular movements intact.     Conjunctiva/sclera: Conjunctivae normal.  Cardiovascular:     Rate and Rhythm: Normal rate and regular rhythm.  Pulmonary:     Effort: Pulmonary effort is normal.     Breath sounds: Normal breath sounds. No wheezing or rales.  Musculoskeletal:     Cervical back: Neck supple.  Lymphadenopathy:     Cervical: No cervical adenopathy.  Skin:    General: Skin is warm and dry.  Neurological:     Mental Status: She is oriented to person, place, and time.     Coordination: Coordination normal.           Assessment & Plan:  Dizziness Assessment & Plan: Likely inner ear cause.  Neuroexam today negative. No obvious infection. Blood pressure today is low normal, she does not  prefer to reduce her hydrochlorothiazide dose as it helps a lot with lower extremity edema.  She appears well today, not sickly.  Start meclizine 12.5 mg 3 times daily as needed. Start Epley maneuvers at home.  Continue Claritin and  Flonase.  She will update in a few days.         Doreene Nest, NP

## 2022-09-14 NOTE — Patient Instructions (Signed)
Start meclizine 12.5 mg every 8 hours as needed for dizziness.  Start the Epley maneuver exercises.  Continue Flonase and Claritin.  Please update me in a few days.  It was a pleasure to see you today!

## 2022-09-16 MED ORDER — PREDNISONE 20 MG PO TABS: ORAL_TABLET | ORAL | 0 refills | Status: DC

## 2022-10-24 NOTE — Progress Notes (Signed)
Hannah Boehler T. Renita Brocks, MD, CAQ Sports Medicine [facility] [address] [place], [address]  Phone: [phone]  FAX: [phone]  Hannah Rivas - 62 y.o. female  MRN [id number]  Date of Birth: [birth year]  Date: 10/27/2022  PCP: Doreene Nest, NP  Referral: Doreene Nest, NP  Chief Complaint  Patient presents with   Shoulder Pain    C/o B shoulder pain. Started about 5 mos ago after lifting weights.     Urinary Frequency    C/o having to urinate often, urinary urgency and blood on paper when wiping. Sxs started 10/25/22.   Subjective:   Hannah Rivas is a 62 y.o. very pleasant female patient with Body mass index is 20.56 kg/m. who presents with the following:  The patient is here for new evaluation of bilateral shoulder pain.  She has really been having pain over the last 5 months.  She is not sure if she had a distinct injury, she does recall even at that time she was having some pain with pressing movements.  At baseline she does not have pain at rest, and she has no pain with activities of daily living including cooking, cleaning, and any form of outdoor work.  She does have some pain in the anterior shoulder and particularly with doing a bench press/push-ups as well as a incline press and pec fly.  She has never had any bruising or muscular deficit.  No defect.  Strength is been relatively preserved.  She has dropped down on her weights in regards to much of her upper body movement.  Overhead press is not particularly painful, but she does not do this with any form of heavy weight.  Started out in May, and took some time off from the gym.    Mostly from bench press and flies.   She also has concerns for possible UTI  Review of Systems is noted in the HPI, as appropriate  Objective:   BP 112/64   Pulse 66   Temp 98 F (36.7 C) (Oral)   Ht 5\' 7"  (1.702 m)   Wt 131 lb 4 oz (59.5 kg)   SpO2 99%   BMI 20.56  kg/m   GEN: No acute distress; alert,appropriate. PULM: Breathing comfortably in no respiratory distress PSYCH: Normally interactive.    Shoulder: B Inspection: No muscle wasting or winging Ecchymosis/edema: neg  AC joint, scapula, clavicle: NT She does have some tenderness in the bicipital groove bilaterally Cervical spine: NT, full ROM Spurling's: neg Abduction: full, 5/5 Flexion: full, 5/5 IR, full, lift-off: 5/5 ER at neutral: full, 5/5 AC crossover and compression: neg Neer: neg Hawkins: Minimal to mildly positive on the left Drop Test: neg Jobe: neg Supraspinatus insertion: NT Bicipital groove: Tender to palpation bilaterally Speed's: Mildly positive Yergason's: Positive bilaterally Sulcus sign: neg Scapular dyskinesis: none C5-T1 intact Sensation intact Grip 5/5   Laboratory and Imaging Data: Results for orders placed or performed in visit on 10/27/22  POCT Urinalysis Dipstick (Automated)  Result Value Ref Range   Color, UA yellow    Clarity, UA cloudy    Glucose, UA Negative Negative   Bilirubin, UA negative    Ketones, UA negative    Spec Grav, UA 1.015 1.010 - 1.025   Blood, UA 2+    pH, UA 5.0 5.0 - 8.0   Protein, UA Negative Negative   Urobilinogen, UA 0.2 0.2 or 1.0 E.U./dL   Nitrite, UA negative    Leukocytes,  UA Large (3+) (A) Negative     Assessment and Plan:     ICD-10-CM   1. Biceps tendonitis of both shoulders  M75.21    M75.22     2. Urinary frequency  R35.0 POCT Urinalysis Dipstick (Automated)    Urine Culture    3. Rotator cuff tendonitis, left  M75.82     4. Rotator cuff tendonitis, right  M75.81      Probable UTI.  Culture.  Antibiotics.  Bilateral biceps tendinopathy and the long head of the biceps tendon.  Also think she has some mild rotator cuff tendinopathy, more on the left.  This is fairly minimal and mildly provide couple.  Think that some of her movements that she is doing in particular pack fly and incline press  would stress both the biceps tendon and the rotator cuff.  I gave her the moon shoulder protocol to work on for now at home.  If she is still having issues and 3 to 4 weeks, we can certainly get her into physical therapy.  Medication Management during today's office visit: Meds ordered this encounter  Medications   sulfamethoxazole-trimethoprim (BACTRIM DS) 800-160 MG tablet    Sig: Take 1 tablet by mouth 2 (two) times daily.    Dispense:  14 tablet    Refill:  0   Medications Discontinued During This Encounter  Medication Reason   meclizine (ANTIVERT) 12.5 MG tablet Completed Course   predniSONE (DELTASONE) 20 MG tablet Completed Course    Orders placed today for conditions managed today: Orders Placed This Encounter  Procedures   Urine Culture   POCT Urinalysis Dipstick (Automated)    Disposition: No follow-ups on file.  [employer] One speech-to-text software was used for transcription in this dictation.  Possible transcriptional errors can occur using Animal nutritionist.   Signed,  Elpidio Galea. Chavon Lucarelli, MD   Outpatient Encounter Medications as of 10/27/2022  Medication Sig   Cholecalciferol (VITAMIN D3) 250 MCG (10000 UT) capsule Take 10,000 Units by mouth daily.   hydrochlorothiazide (HYDRODIURIL) 25 MG tablet Take 1 tablet (25 mg total) by mouth daily.   Magnesium 100 MG TABS Take by mouth.   Multiple Vitamin (MULTIVITAMIN) tablet Take 1 tablet by mouth daily.   potassium chloride SA (KLOR-CON M) 20 MEQ tablet Take 1 tablet (20 mEq total) by mouth daily. For low potassium.   sulfamethoxazole-trimethoprim (BACTRIM DS) 800-160 MG tablet Take 1 tablet by mouth 2 (two) times daily.   [DISCONTINUED] meclizine (ANTIVERT) 12.5 MG tablet Take 1 tablet (12.5 mg total) by mouth 3 (three) times daily as needed for dizziness.   [DISCONTINUED] predniSONE (DELTASONE) 20 MG tablet Take 2 tablets by mouth once daily for 5 days.   No facility-administered encounter medications on file  as of 10/27/2022.

## 2022-10-27 ENCOUNTER — Ambulatory Visit: Payer: Commercial Managed Care - PPO

## 2022-10-27 ENCOUNTER — Ambulatory Visit: Payer: Commercial Managed Care - PPO | Admitting: Family Medicine

## 2022-10-27 VITALS — BP 112/64 | HR 66 | Temp 98.0°F | Ht 67.0 in | Wt 131.2 lb

## 2022-10-27 DIAGNOSIS — M7521 Bicipital tendinitis, right shoulder: Secondary | ICD-10-CM

## 2022-10-27 DIAGNOSIS — M7522 Bicipital tendinitis, left shoulder: Secondary | ICD-10-CM

## 2022-10-27 DIAGNOSIS — R350 Frequency of micturition: Secondary | ICD-10-CM | POA: Diagnosis not present

## 2022-10-27 DIAGNOSIS — M7582 Other shoulder lesions, left shoulder: Secondary | ICD-10-CM | POA: Diagnosis not present

## 2022-10-27 DIAGNOSIS — M7581 Other shoulder lesions, right shoulder: Secondary | ICD-10-CM | POA: Diagnosis not present

## 2022-10-27 LAB — POC URINALSYSI DIPSTICK (AUTOMATED)
Bilirubin, UA: NEGATIVE
Glucose, UA: NEGATIVE
Ketones, UA: NEGATIVE
Nitrite, UA: NEGATIVE
Protein, UA: NEGATIVE
Spec Grav, UA: 1.015 (ref 1.010–1.025)
Urobilinogen, UA: 0.2 U/dL
pH, UA: 5 (ref 5.0–8.0)

## 2022-10-27 MED ORDER — SULFAMETHOXAZOLE-TRIMETHOPRIM 800-160 MG PO TABS: 1.0000 | ORAL_TABLET | Freq: Two times a day (BID) | ORAL | 0 refills | Status: DC

## 2022-10-28 LAB — URINE CULTURE
MICRO NUMBER:: 15542395
Result:: NO GROWTH
SPECIMEN QUALITY:: ADEQUATE

## 2022-11-04 ENCOUNTER — Ambulatory Visit (INDEPENDENT_AMBULATORY_CARE_PROVIDER_SITE_OTHER): Payer: Commercial Managed Care - PPO

## 2022-11-04 DIAGNOSIS — Z23 Encounter for immunization: Secondary | ICD-10-CM | POA: Diagnosis not present

## 2022-11-04 NOTE — Telephone Encounter (Signed)
Completed and placed in Kelli's inbox. 

## 2022-11-15 NOTE — Telephone Encounter (Signed)
Patient came to office to pick up form, patient filled out waist measurements and signed form. Pt requested for form to be faxed to employer, I did as requested and faxed to secure fax of [employer] employee services. PT was given a copy for her records

## 2023-02-05 ENCOUNTER — Other Ambulatory Visit: Payer: Self-pay | Admitting: Primary Care

## 2023-02-05 DIAGNOSIS — R600 Localized edema: Secondary | ICD-10-CM

## 2023-02-06 NOTE — Telephone Encounter (Signed)
 Patient is due for CPE/follow up in April, this will be required prior to any further refills.  Please schedule, thank you!

## 2023-02-07 NOTE — Telephone Encounter (Signed)
 Spoke to pt, scheduled cpe for 05/06/23

## 2023-03-08 ENCOUNTER — Other Ambulatory Visit: Payer: Self-pay | Admitting: Primary Care

## 2023-03-08 DIAGNOSIS — Z1231 Encounter for screening mammogram for malignant neoplasm of breast: Secondary | ICD-10-CM

## 2023-03-23 DIAGNOSIS — E876 Hypokalemia: Secondary | ICD-10-CM

## 2023-03-23 DIAGNOSIS — R600 Localized edema: Secondary | ICD-10-CM

## 2023-03-24 MED ORDER — POTASSIUM CHLORIDE CRYS ER 20 MEQ PO TBCR
20.0000 meq | EXTENDED_RELEASE_TABLET | Freq: Every day | ORAL | 0 refills | Status: DC
Start: 2023-03-24 — End: 2023-09-27

## 2023-03-24 MED ORDER — HYDROCHLOROTHIAZIDE 25 MG PO TABS
25.0000 mg | ORAL_TABLET | Freq: Every day | ORAL | 0 refills | Status: DC
Start: 2023-03-24 — End: 2023-06-05

## 2023-04-22 ENCOUNTER — Ambulatory Visit
Admission: RE | Admit: 2023-04-22 | Discharge: 2023-04-22 | Disposition: A | Discharge status: Home / Self Care | Payer: Commercial Managed Care - PPO | Source: Ambulatory Visit | Attending: Primary Care | Admitting: Primary Care

## 2023-04-22 DIAGNOSIS — Z1231 Encounter for screening mammogram for malignant neoplasm of breast: Secondary | ICD-10-CM | POA: Insufficient documentation

## 2023-05-06 ENCOUNTER — Ambulatory Visit: Payer: Commercial Managed Care - PPO | Admitting: Primary Care

## 2023-05-06 ENCOUNTER — Encounter: Payer: Self-pay | Admitting: Primary Care

## 2023-05-06 VITALS — BP 106/64 | HR 81 | Temp 97.3°F | Ht 67.0 in | Wt 127.0 lb

## 2023-05-06 DIAGNOSIS — R600 Localized edema: Secondary | ICD-10-CM

## 2023-05-06 DIAGNOSIS — Z0000 Encounter for general adult medical examination without abnormal findings: Secondary | ICD-10-CM

## 2023-05-06 DIAGNOSIS — G8929 Other chronic pain: Secondary | ICD-10-CM | POA: Diagnosis not present

## 2023-05-06 DIAGNOSIS — R519 Headache, unspecified: Secondary | ICD-10-CM | POA: Diagnosis not present

## 2023-05-06 DIAGNOSIS — Z8349 Family history of other endocrine, nutritional and metabolic diseases: Secondary | ICD-10-CM | POA: Diagnosis not present

## 2023-05-06 DIAGNOSIS — E782 Mixed hyperlipidemia: Secondary | ICD-10-CM

## 2023-05-06 LAB — LIPID PANEL
Cholesterol: 194 mg/dL (ref 0–200)
HDL: 76.8 mg/dL (ref >39.00)
LDL Cholesterol: 105 mg/dL — ABNORMAL HIGH (ref 0–99)
NonHDL: 116.73
Total CHOL/HDL Ratio: 3
Triglycerides: 61 mg/dL (ref 0.0–149.0)
VLDL: 12.2 mg/dL (ref 0.0–40.0)

## 2023-05-06 LAB — COMPREHENSIVE METABOLIC PANEL WITH GFR
ALT: 16 U/L (ref 0–35)
AST: 25 U/L (ref 0–37)
Albumin: 4.4 g/dL (ref 3.5–5.2)
Alkaline Phosphatase: 50 U/L (ref 39–117)
BUN: 15 mg/dL (ref 6–23)
CO2: 34 meq/L — ABNORMAL HIGH (ref 19–32)
Calcium: 9.3 mg/dL (ref 8.4–10.5)
Chloride: 98 meq/L (ref 96–112)
Creatinine, Ser: 0.82 mg/dL (ref 0.40–1.20)
GFR: 75.59 mL/min (ref >60.00)
Glucose, Bld: 93 mg/dL (ref 70–99)
Potassium: 3.7 meq/L (ref 3.5–5.1)
Sodium: 139 meq/L (ref 135–145)
Total Bilirubin: 0.5 mg/dL (ref 0.2–1.2)
Total Protein: 6.4 g/dL (ref 6.0–8.3)

## 2023-05-06 LAB — CBC
HCT: 41 % (ref 36.0–46.0)
Hemoglobin: 13.7 g/dL (ref 12.0–15.0)
MCHC: 33.3 g/dL (ref 30.0–36.0)
MCV: 85.1 fl (ref 78.0–100.0)
Platelets: 176 10*3/uL (ref 150.0–400.0)
RBC: 4.81 Mil/uL (ref 3.87–5.11)
RDW: 12.6 % (ref 11.5–15.5)
WBC: 3.9 10*3/uL — ABNORMAL LOW (ref 4.0–10.5)

## 2023-05-06 LAB — TSH: TSH: 1.31 u[IU]/mL (ref 0.35–5.50)

## 2023-05-06 NOTE — Assessment & Plan Note (Signed)
 Stable and controlled.  No concerns today. Continue to monitor.

## 2023-05-06 NOTE — Assessment & Plan Note (Signed)
 Controlled.  Continue hydrochlorothiazide 25 mg daily per her preference.  Continue potassium chloride 20 mEq daily.  CMP pending.

## 2023-05-06 NOTE — Progress Notes (Signed)
 Subjective:    Patient ID: Hannah Rivas, female    DOB: 1960/06/18, 63 y.o.   MRN: [id number]  HPI  Hannah Rivas is a very pleasant 63 y.o. female who presents today for complete physical and follow up of chronic conditions.  Immunizations: -Tetanus: Completed in 2020 -Influenza: Completed last season  -Shingles: Completed Zostavax  Diet: Fair diet.  Exercise: Regular exercise at home   Eye exam: Completes annually  Dental exam: Completes semi-annually    Pap Smear: Hysterectomy  Mammogram: Completed in March 2025  Colonoscopy: Completed in 2022, due 2029  BP Readings from Last 3 Encounters:  05/06/23 106/64  10/27/22 112/64  09/14/22 102/70       Review of Systems  Constitutional:  Negative for unexpected weight change.  HENT:  Negative for rhinorrhea.   Respiratory:  Negative for cough and shortness of breath.   Cardiovascular:  Negative for chest pain.  Gastrointestinal:  Negative for constipation and diarrhea.  Genitourinary:  Negative for difficulty urinating.  Musculoskeletal:  Negative for arthralgias and myalgias.  Skin:  Negative for rash.  Allergic/Immunologic: Negative for environmental allergies.  Neurological:  Negative for dizziness, numbness and headaches.  Psychiatric/Behavioral:  The patient is not nervous/anxious.          Past Medical History:  Diagnosis Date   Arthritis    bilateral thumbs   Basal cell carcinoma    Breast cancer (HCC) 2005   LEFT   Chest pain 04/16/2020   COVID-19 virus infection 01/29/2022   Edema    lower extremity    Frequent headaches    Laryngitis 03/24/2021   Pain of left upper extremity 10/07/2017   Personal history of radiation therapy 2005   Left breast    Social History   Socioeconomic History   Marital status: Married    Spouse name: Not on file   Number of children: Not on file   Years of education: Not on file   Highest education level: Bachelor's degree (e.g., BA, AB, BS)   Occupational History   Not on file  Tobacco Use   Smoking status: Never   Smokeless tobacco: Never  Vaping Use   Vaping status: Never Used  Substance and Sexual Activity   Alcohol use: Yes    Alcohol/week: 1.0 standard drink of alcohol    Types: 1 Standard drinks or equivalent per week   Drug use: Never   Sexual activity: Not on file  Other Topics Concern   Not on file  Social History Narrative   Not on file   Social Drivers of Health   Financial Resource Strain: Low Risk  (10/27/2022)   Overall Financial Resource Strain (CARDIA)    Difficulty of Paying Living Expenses: Not hard at all  Food Insecurity: No Food Insecurity (10/27/2022)   Hunger Vital Sign    Worried About Running Out of Food in the Last Year: Never true    Ran Out of Food in the Last Year: Never true  Transportation Needs: No Transportation Needs (10/27/2022)   PRAPARE - Administrator, Civil Service (Medical): No    Lack of Transportation (Non-Medical): No  Physical Activity: Sufficiently Active (10/27/2022)   Exercise Vital Sign    Days of Exercise per Week: 4 days    Minutes of Exercise per Session: 60 min  Stress: No Stress Concern Present (10/27/2022)   [employer] of Occupational Health - Occupational Stress Questionnaire    Feeling of Stress : Not at all  Social Connections:  Unknown (10/27/2022)   Social Connection and Isolation Panel [NHANES]    Frequency of Communication with Friends and Family: Twice a week    Frequency of Social Gatherings with Friends and Family: More than three times a week    Attends Religious Services: Patient declined    Database administrator or Organizations: No    Attends Engineer, structural: Not on file    Marital Status: Married  Catering manager Violence: Not on file    Past Surgical History:  Procedure Laterality Date   ABDOMINAL HYSTERECTOMY  2006   partial   BASAL CELL CARCINOMA EXCISION  2012, 2015   BREAST BIOPSY Left 2005    Positive   BREAST BIOPSY Right 1998   neg   BREAST LUMPECTOMY Left 2005   COSMETIC SURGERY  2012   right breast reduction   EYE SURGERY  2009   lasik   FOOT SURGERY Left 2018   KNEE ARTHROSCOPY Left    MANDIBLE SURGERY     not related to TMJ-orthnatic surgery to assit with migraines   PARTIAL HYSTERECTOMY  2006   REDUCTION MAMMAPLASTY Right 2005   TUBAL LIGATION      Family History  Problem Relation Age of Onset   Heart attack Mother    Stroke Father    Glaucoma Father    Hypertension Father    Breast cancer Maternal Aunt    Early death Sister    Stroke Sister    Stroke Brother    Stroke Brother    Colon polyps Neg Hx    Colon cancer Neg Hx    Esophageal cancer Neg Hx    Rectal cancer Neg Hx    Prostate cancer Neg Hx     No Known Allergies  Current Outpatient Medications on File Prior to Visit  Medication Sig Dispense Refill   Cholecalciferol (VITAMIN D3) 250 MCG (10000 UT) capsule Take 10,000 Units by mouth daily.     hydrochlorothiazide (HYDRODIURIL) 25 MG tablet Take 1 tablet (25 mg total) by mouth daily. 90 tablet 0   Magnesium 100 MG TABS Take by mouth.     Multiple Vitamin (MULTIVITAMIN) tablet Take 1 tablet by mouth daily.     potassium chloride SA (KLOR-CON M) 20 MEQ tablet Take 1 tablet (20 mEq total) by mouth daily. For low potassium. 90 tablet 0   No current facility-administered medications on file prior to visit.    BP 106/64   Pulse 81   Temp (!) 97.3 F (36.3 C) (Temporal)   Ht 5\' 7"  (1.702 m)   Wt 127 lb (57.6 kg)   SpO2 99%   BMI 19.89 kg/m  Objective:   Physical Exam HENT:     Right Ear: Tympanic membrane and ear canal normal.     Left Ear: Tympanic membrane and ear canal normal.  Eyes:     Pupils: Pupils are equal, round, and reactive to light.  Cardiovascular:     Rate and Rhythm: Normal rate and regular rhythm.  Pulmonary:     Effort: Pulmonary effort is normal.     Breath sounds: Normal breath sounds.  Abdominal:     General:  Bowel sounds are normal.     Palpations: Abdomen is soft.     Tenderness: There is no abdominal tenderness.  Musculoskeletal:        General: Normal range of motion.     Cervical back: Neck supple.  Skin:    General: Skin is warm and dry.  Neurological:     Mental Status: She is alert and oriented to person, place, and time.     Cranial Nerves: No cranial nerve deficit.     Deep Tendon Reflexes:     Reflex Scores:      Patellar reflexes are 2+ on the right side and 2+ on the left side. Psychiatric:        Mood and Affect: Mood normal.           Assessment & Plan:  Preventative health care Assessment & Plan: Immunizations UTD. Declines Shingrix today  Mammogram  UTD Colonoscopy UTD, due 2029  Discussed the importance of a healthy diet and regular exercise in order for weight loss, and to reduce the risk of further co-morbidity.  Exam stable. Labs pending.  Follow up in 1 year for repeat physical.    Chronic nonintractable headache, unspecified headache type Assessment & Plan: Stable and controlled.  No concerns today. Continue to monitor.    Extremity edema Assessment & Plan: Controlled.  Continue hydrochlorothiazide 25 mg daily per her preference.  Continue potassium chloride 20 mEq daily.  CMP pending.   Family history of thyroid disease Assessment & Plan: Repeat TSH pending.   Mixed hyperlipidemia Assessment & Plan: Repeat lipid panel pending. Commended her on exercise.         Doreene Nest, NP

## 2023-05-06 NOTE — Assessment & Plan Note (Signed)
Repeat TSH pending

## 2023-05-06 NOTE — Assessment & Plan Note (Signed)
 Repeat lipid panel pending. Commended her on exercise.

## 2023-05-06 NOTE — Assessment & Plan Note (Signed)
 Immunizations UTD. Declines Shingrix today  Mammogram  UTD Colonoscopy UTD, due 2029  Discussed the importance of a healthy diet and regular exercise in order for weight loss, and to reduce the risk of further co-morbidity.  Exam stable. Labs pending.  Follow up in 1 year for repeat physical.

## 2023-05-06 NOTE — Addendum Note (Signed)
 Addended by: Doreene Nest on: 05/06/2023 07:45 AM   Modules accepted: Orders

## 2023-05-06 NOTE — Addendum Note (Signed)
 Addended by: Doreene Nest on: 05/06/2023 07:42 AM   Modules accepted: Orders

## 2023-05-24 ENCOUNTER — Encounter: Payer: Self-pay | Admitting: Obstetrics & Gynecology

## 2023-05-24 ENCOUNTER — Ambulatory Visit (INDEPENDENT_AMBULATORY_CARE_PROVIDER_SITE_OTHER): Admitting: Obstetrics & Gynecology

## 2023-05-24 VITALS — BP 100/67 | HR 73 | Ht 67.0 in | Wt 126.0 lb

## 2023-05-24 DIAGNOSIS — Z90710 Acquired absence of both cervix and uterus: Secondary | ICD-10-CM

## 2023-05-24 DIAGNOSIS — Z01419 Encounter for gynecological examination (general) (routine) without abnormal findings: Secondary | ICD-10-CM | POA: Diagnosis not present

## 2023-05-24 NOTE — Progress Notes (Signed)
 GYNECOLOGY ANNUAL PREVENTATIVE CARE ENCOUNTER NOTE  History:     Hannah Rivas is a 63 y.o. PMP female  with history of total vaginal hysterectomy in 2006 for fibroids and bleeding here for a routine annual gynecologic exam.  Current complaints: had some swelling in her upper legs, happens to women in her family too. Her other providers told her it may be hormonally mediated, she came to discuss this.  No vasomotor symptoms reported.  She is wondering if she needs a pap smear, has not had one in years, no history of abnormal paps.  Denies abnormal vaginal bleeding, discharge, pelvic pain, or other gynecologic concerns.    Gynecologic History No LMP recorded. Patient has had a hysterectomy. Last Mammogram: 04/22/2023.  Result was normal Last Colonoscopy: 07/04/2020.  Result was normal No history of abnormal paps prior to hysterectomy or other conditions other than fibroids  Past Medical History:  Diagnosis Date   Arthritis    bilateral thumbs   Basal cell carcinoma    Breast cancer (HCC) 2005   LEFT   Chest pain 04/16/2020   COVID-19 virus infection 01/29/2022   Edema    lower extremity    Frequent headaches    Laryngitis 03/24/2021   Pain of left upper extremity 10/07/2017   Personal history of radiation therapy 2005   Left breast    Past Surgical History:  Procedure Laterality Date   BASAL CELL CARCINOMA EXCISION  2012, 2015   BREAST BIOPSY Left 2005   Positive   BREAST BIOPSY Right 1998   neg   BREAST LUMPECTOMY Left 2005   COSMETIC SURGERY  2012   right breast reduction   EYE SURGERY  2009   lasik   FOOT SURGERY Left 2018   KNEE ARTHROSCOPY Left    MANDIBLE SURGERY     not related to TMJ-orthnatic surgery to assit with migraines   REDUCTION MAMMAPLASTY Right 2005   TOTAL VAGINAL HYSTERECTOMY  2006   Ovaries in place   TUBAL LIGATION      Current Outpatient Medications on File Prior to Visit  Medication Sig Dispense Refill   Ashwagandha 120 MG CAPS       Cholecalciferol (VITAMIN D3) 250 MCG (10000 UT) capsule Take 10,000 Units by mouth daily.     hydrochlorothiazide  (HYDRODIURIL ) 25 MG tablet Take 1 tablet (25 mg total) by mouth daily. 90 tablet 0   Magnesium 100 MG TABS Take by mouth.     Multiple Vitamin (MULTIVITAMIN) tablet Take 1 tablet by mouth daily.     potassium chloride  SA (KLOR-CON  M) 20 MEQ tablet Take 1 tablet (20 mEq total) by mouth daily. For low potassium. 90 tablet 0   No current facility-administered medications on file prior to visit.    No Known Allergies  Social History:  reports that she has never smoked. She has never used smokeless tobacco. She reports current alcohol use of about 1.0 standard drink of alcohol per week. She reports that she does not use drugs.  Family History  Problem Relation Age of Onset   Heart attack Mother    Stroke Father    Glaucoma Father    Hypertension Father    Breast cancer Maternal Aunt    Early death Sister    Stroke Sister    Stroke Brother    Stroke Brother    Colon polyps Neg Hx    Colon cancer Neg Hx    Esophageal cancer Neg Hx    Rectal cancer  Neg Hx    Prostate cancer Neg Hx     The following portions of the patient's history were reviewed and updated as appropriate: allergies, current medications, past family history, past medical history, past social history, past surgical history and problem list.  Review of Systems Pertinent items noted in HPI and remainder of comprehensive ROS otherwise negative.  Physical Exam:  BP 100/67   Pulse 73   Ht 5\' 7"  (1.702 m)   Wt 126 lb (57.2 kg)   BMI 19.73 kg/m  CONSTITUTIONAL: Well-developed, well-nourished female in no acute distress.  SKIN: Skin is warm and dry. No rash noted. Not diaphoretic. No erythema. No pallor. MUSCULOSKELETAL: Normal range of motion. No tenderness.  No cyanosis, clubbing, or edema. NEUROLOGIC: Alert and oriented to person, place, and time. Normal muscle tone coordination.  PSYCHIATRIC: Normal  mood and affect. Normal behavior. Normal judgment and thought content. CARDIOVASCULAR: Normal heart rate noted RESPIRATORY: . Effort and breath sounds normal, no problems with respiration noted. BREASTS: Deferred by patient. ABDOMEN: Soft, no distention noted.  No tenderness, rebound or guarding.  PELVIC: Normal appearing external genitalia and urethral meatus with moderate atrophy; atrophic vaginal mucosa and cuff.  No abnormal vaginal discharge noted. No other palpable masses, no adnexal tenderness.  Performed in the presence of a chaperone.   Assessment and Plan:     1. S/P vaginal hysterectomy 2. Well woman exam with routine gynecological exam (Primary) Patient was reassured that given her hysterectomy was done for benign reasons and givenabsence of cervix, there is no need for pap smears.  Discussed her edema, this is not a symptom we usually see in as a result of estrogen/progesterone deficiency in postmenopausal state.  No reproductive hormonal intervention indicated for this symptom. Asked to follow up with her PCP or perhaps Endocrinology for evaluation of other hormonal syndromes. Mammogram  and colon cancer screening are up to date. Routine preventative health maintenance measures emphasized. Please refer to After Visit Summary for other counseling recommendations.      Lenoard Rad, MD, FACOG Obstetrician & Gynecologist, [facility] for [employer], [facility]

## 2023-06-04 ENCOUNTER — Other Ambulatory Visit: Payer: Self-pay | Admitting: Primary Care

## 2023-06-04 DIAGNOSIS — R600 Localized edema: Secondary | ICD-10-CM

## 2023-09-27 DIAGNOSIS — E876 Hypokalemia: Secondary | ICD-10-CM

## 2023-09-27 MED ORDER — POTASSIUM CHLORIDE CRYS ER 20 MEQ PO TBCR: 20.0000 meq | EXTENDED_RELEASE_TABLET | Freq: Every day | ORAL | 1 refills | Status: AC

## 2023-10-25 ENCOUNTER — Ambulatory Visit: Admitting: Nurse Practitioner

## 2023-10-25 ENCOUNTER — Ambulatory Visit: Payer: Self-pay

## 2023-10-25 ENCOUNTER — Encounter: Payer: Self-pay | Admitting: Nurse Practitioner

## 2023-10-25 VITALS — BP 118/66 | HR 77 | Temp 98.0°F | Resp 16 | Ht 67.0 in | Wt 127.0 lb

## 2023-10-25 DIAGNOSIS — J019 Acute sinusitis, unspecified: Secondary | ICD-10-CM | POA: Diagnosis not present

## 2023-10-25 LAB — POC COVID19/FLU A&B COMBO
Covid Antigen, POC: NEGATIVE
Influenza A Antigen, POC: NEGATIVE
Influenza B Antigen, POC: NEGATIVE

## 2023-10-25 MED ORDER — AMOXICILLIN-POT CLAVULANATE 875-125 MG PO TABS: 1.0000 | ORAL_TABLET | Freq: Two times a day (BID) | ORAL | 0 refills | Status: DC

## 2023-10-25 NOTE — Telephone Encounter (Signed)
 FYI Only or Action Required?: FYI only for provider.  Patient was last seen in primary care on 05/06/2023 by Gretta Comer POUR, NP.  Called Nurse Triage reporting Sinusitis.  Symptoms began over 1 week ago.  Interventions attempted: increasing fluids.  Symptoms are: gradually worsening.  Triage Disposition: See PCP When Office is Open (Within 3 Days)  Patient/caregiver understands and will follow disposition?: Yes                      Copied from CRM [id number]. Topic: Clinical - Red Word Triage >> Oct 25, 2023  8:44 AM Hamdi H wrote: Red Word that prompted transfer to Nurse Triage: Sinus infection, a lot of congestion, woozy in the head, discolored mucus pinkish and green. Reason for Disposition  [1] Sinus congestion (pressure, fullness) AND [2] present > 10 days  Answer Assessment - Initial Assessment Questions 1. LOCATION: Where does it hurt?      Mask of face 2. ONSET: When did the sinus pain start?  (e.g., hours, days)      1 week - gotten much worse weds of last week 3. SEVERITY: How bad is the pain?   (Scale 0-10; or none, mild, moderate or severe)     No pain 4. RECURRENT SYMPTOM: Have you ever had sinus problems before? If Yes, ask: When was the last time? and What happened that time?      yes 5. NASAL CONGESTION: Is the nose blocked? If Yes, ask: Can you open it or must you breathe through your mouth?     Head feels full 6. NASAL DISCHARGE: Do you have discharge from your nose? If so ask, What color?     Green - pink 7. FEVER: Do you have a fever? If Yes, ask: What is it, how was it measured, and when did it start?      no 8. OTHER SYMPTOMS: Do you have any other symptoms? (e.g., sore throat, cough, earache, difficulty breathing)     Woozy when turning her head  Protocols used: Sinus Pain or Congestion-A-AH

## 2023-10-25 NOTE — Progress Notes (Signed)
 BP 118/66   Pulse 77   Temp 98 F (36.7 C)   Resp 16   Ht 5' 7 (1.702 m)   Wt 127 lb (57.6 kg)   SpO2 100%   BMI 19.89 kg/m    Subjective:    Patient ID: Hannah Rivas, female    DOB: 09/27/1960, 63 y.o.   MRN: [id number]  HPI: Hannah Rivas is a 63 y.o. female  Chief Complaint  Patient presents with   Sinusitis    X1 week. Scratchy throat/head pressure   Discussed the use of AI scribe software for clinical note transcription with the patient, who gave verbal consent to proceed.  History of Present Illness Hannah Rivas is a 63 year old female who presents with symptoms of a sinus infection.  Upper respiratory symptoms - Nasal congestion present for over one week, progressively worsening - Head pressure and facial pressure - Sensation of fullness in the ears - Significant nasal drainage, with some drainage appearing pink this morning - Head feels 'really cold' - No ear pain, scratchy throat, fever, or shortness of breath  Medication use and response - Taking Mucinex (not the 'D' version), which has not been effective today - Regularly uses Flonase and Claritin daily         05/24/2023    9:02 AM 05/06/2023    7:23 AM 10/27/2022   12:00 PM  Depression screen PHQ 2/9  Decreased Interest 0 0 0  Down, Depressed, Hopeless 0 0 0  PHQ - 2 Score 0 0 0  Altered sleeping 0  3  Tired, decreased energy 0  0  Change in appetite 0  0  Feeling bad or failure about yourself  0  0  Trouble concentrating 0  0  Moving slowly or fidgety/restless 0  0  Suicidal thoughts 0  0  PHQ-9 Score 0  3  Difficult doing work/chores Not difficult at all  Not difficult at all    Relevant past medical, surgical, family and social history reviewed and updated as indicated. Interim medical history since our last visit reviewed. Allergies and medications reviewed and updated.  Review of Systems  Ten systems reviewed and is negative except as mentioned in HPI      Objective:       BP 118/66   Pulse 77   Temp 98 F (36.7 C)   Resp 16   Ht 5' 7 (1.702 m)   Wt 127 lb (57.6 kg)   SpO2 100%   BMI 19.89 kg/m    Wt Readings from Last 3 Encounters:  10/25/23 127 lb (57.6 kg)  05/24/23 126 lb (57.2 kg)  05/06/23 127 lb (57.6 kg)    Physical Exam GENERAL: Alert, cooperative, well developed, no acute distress HEENT: Normocephalic, normal oropharynx, moist mucous membranes, no sinus tenderness CHEST: Clear to auscultation bilaterally, no wheezes, rhonchi, or crackles CARDIOVASCULAR: Normal heart rate and rhythm, S1 and S2 normal without murmurs ABDOMEN: Soft, non-tender, non-distended, without organomegaly, normal bowel sounds EXTREMITIES: No cyanosis or edema NEUROLOGICAL: Cranial nerves grossly intact, moves all extremities without gross motor or sensory deficit  Results for orders placed or performed in visit on 10/25/23  POC Covid19/Flu A&B Antigen   Collection Time: 10/25/23 10:07 AM  Result Value Ref Range   Influenza A Antigen, POC Negative Negative   Influenza B Antigen, POC Negative Negative   Covid Antigen, POC Negative Negative          Assessment & Plan:   Problem List Items  Addressed This Visit   None Visit Diagnoses       Acute non-recurrent sinusitis, unspecified location    -  Primary   Relevant Medications   amoxicillin -clavulanate (AUGMENTIN ) 875-125 MG tablet   Other Relevant Orders   POC Covid19/Flu A&B Antigen (Completed)        Assessment and Plan Assessment & Plan Acute sinusitis Symptoms consistent with acute sinusitis, including nasal congestion, head and facial pressure, and drainage, persisting for over a week with worsening severity. No fever, cough, or shortness of breath. Negative COVID-19 and influenza tests. No ear pain but fullness present. No medication allergies or history of yeast infections with antibiotics. - Prescribe Augmentin  to be taken twice daily for 10 days. - Recommend continued use of  Mucinex, Zyrtec or Claritin, and Flonase. - Advise increased fluid intake for hydration.        Follow up plan: Return if symptoms worsen or fail to improve.

## 2023-11-09 ENCOUNTER — Ambulatory Visit (INDEPENDENT_AMBULATORY_CARE_PROVIDER_SITE_OTHER)

## 2023-11-09 DIAGNOSIS — Z23 Encounter for immunization: Secondary | ICD-10-CM

## 2024-01-07 ENCOUNTER — Other Ambulatory Visit: Payer: Self-pay | Admitting: Primary Care

## 2024-01-07 DIAGNOSIS — R600 Localized edema: Secondary | ICD-10-CM

## 2024-01-11 NOTE — Telephone Encounter (Signed)
 Can we call [facility] and understand the issue? She's trying to refill her hydrochlorothiazide  and has at least one refill remaining according to our records.

## 2024-01-12 MED ORDER — HYDROCHLOROTHIAZIDE 25 MG PO TABS: 25.0000 mg | ORAL_TABLET | Freq: Every day | ORAL | 0 refills | Status: AC

## 2024-02-29 ENCOUNTER — Encounter: Payer: Self-pay | Admitting: Primary Care

## 2024-02-29 ENCOUNTER — Ambulatory Visit: Payer: Self-pay | Admitting: Primary Care

## 2024-02-29 ENCOUNTER — Ambulatory Visit: Admitting: Primary Care

## 2024-02-29 VITALS — BP 118/66 | HR 78 | Temp 97.9°F | Ht 66.5 in | Wt 128.1 lb

## 2024-02-29 DIAGNOSIS — Z23 Encounter for immunization: Secondary | ICD-10-CM

## 2024-02-29 DIAGNOSIS — Z1231 Encounter for screening mammogram for malignant neoplasm of breast: Secondary | ICD-10-CM

## 2024-02-29 DIAGNOSIS — G8929 Other chronic pain: Secondary | ICD-10-CM

## 2024-02-29 DIAGNOSIS — E559 Vitamin D deficiency, unspecified: Secondary | ICD-10-CM

## 2024-02-29 DIAGNOSIS — E782 Mixed hyperlipidemia: Secondary | ICD-10-CM

## 2024-02-29 DIAGNOSIS — R600 Localized edema: Secondary | ICD-10-CM

## 2024-02-29 DIAGNOSIS — Z0000 Encounter for general adult medical examination without abnormal findings: Secondary | ICD-10-CM | POA: Insufficient documentation

## 2024-02-29 DIAGNOSIS — Z8349 Family history of other endocrine, nutritional and metabolic diseases: Secondary | ICD-10-CM

## 2024-02-29 DIAGNOSIS — E2839 Other primary ovarian failure: Secondary | ICD-10-CM

## 2024-02-29 LAB — COMPREHENSIVE METABOLIC PANEL WITH GFR
ALT: 19 U/L (ref 3–35)
AST: 27 U/L (ref 5–37)
Albumin: 4.2 g/dL (ref 3.5–5.2)
Alkaline Phosphatase: 43 U/L (ref 39–117)
BUN: 14 mg/dL (ref 6–23)
CO2: 30 meq/L (ref 19–32)
Calcium: 9.8 mg/dL (ref 8.4–10.5)
Chloride: 100 meq/L (ref 96–112)
Creatinine, Ser: 0.74 mg/dL (ref 0.40–1.20)
GFR: 85 mL/min
Glucose, Bld: 84 mg/dL (ref 70–99)
Potassium: 3.8 meq/L (ref 3.5–5.1)
Sodium: 140 meq/L (ref 135–145)
Total Bilirubin: 0.4 mg/dL (ref 0.2–1.2)
Total Protein: 6.4 g/dL (ref 6.0–8.3)

## 2024-02-29 LAB — CBC WITH DIFFERENTIAL/PLATELET
Basophils Absolute: 0.1 10*3/uL (ref 0.0–0.1)
Basophils Relative: 1.3 % (ref 0.0–3.0)
Eosinophils Absolute: 0.1 10*3/uL (ref 0.0–0.7)
Eosinophils Relative: 2.2 % (ref 0.0–5.0)
HCT: 38.9 % (ref 36.0–46.0)
Hemoglobin: 12.8 g/dL (ref 12.0–15.0)
Lymphocytes Relative: 20.3 % (ref 12.0–46.0)
Lymphs Abs: 0.9 10*3/uL (ref 0.7–4.0)
MCHC: 32.9 g/dL (ref 30.0–36.0)
MCV: 84.2 fl (ref 78.0–100.0)
Monocytes Absolute: 0.4 10*3/uL (ref 0.1–1.0)
Monocytes Relative: 8.8 % (ref 3.0–12.0)
Neutro Abs: 3.1 10*3/uL (ref 1.4–7.7)
Neutrophils Relative %: 67.4 % (ref 43.0–77.0)
Platelets: 171 10*3/uL (ref 150.0–400.0)
RBC: 4.62 Mil/uL (ref 3.87–5.11)
RDW: 12.9 % (ref 11.5–15.5)
WBC: 4.5 10*3/uL (ref 4.0–10.5)

## 2024-02-29 LAB — VITAMIN D 25 HYDROXY (VIT D DEFICIENCY, FRACTURES): VITD: 51.05 ng/mL (ref 30.00–100.00)

## 2024-02-29 LAB — TSH: TSH: 1.27 u[IU]/mL (ref 0.35–5.50)

## 2024-02-29 LAB — LIPID PANEL
Cholesterol: 187 mg/dL (ref 28–200)
HDL: 80 mg/dL
LDL Cholesterol: 91 mg/dL (ref 10–99)
NonHDL: 106.98
Total CHOL/HDL Ratio: 2
Triglycerides: 82 mg/dL (ref 10.0–149.0)
VLDL: 16.4 mg/dL (ref 0.0–40.0)

## 2024-02-29 NOTE — Assessment & Plan Note (Signed)
 No concerns today. Continue to monitor.

## 2024-02-29 NOTE — Progress Notes (Signed)
 "  Chief Complaint  Patient presents with   Medicare Wellness    Welcome     Subjective:   Hannah Rivas is a 64 y.o. female who presents for a Welcome to Medicare Exam.   Visit info / Clinical Intake: Medicare Wellness Visit Type:: Welcome to [employer] (IPPE) Persons participating in visit and providing information:: patient Medicare Wellness Visit Mode:: In-person (required for [facility]) Interpreter Needed?: No Pre-visit prep was completed: no AWV questionnaire completed by patient prior to visit?: yes Date:: 02/28/24 Living arrangements:: (Patient-Rptd) lives with spouse/significant other Patient's Overall Health Status Rating: (Patient-Rptd) good Typical amount of pain: (Patient-Rptd) some Does pain affect daily life?: (Patient-Rptd) no Are you currently prescribed opioids?: no  Dietary Habits and Nutritional Risks How many meals a day?: (Patient-Rptd) 3 Eats fruit and vegetables daily?: (Patient-Rptd) yes Most meals are obtained by: (Patient-Rptd) preparing own meals In the last 2 weeks, have you had any of the following?: none Diabetic:: no  Functional Status Activities of Daily Living (to include ambulation/medication): (Patient-Rptd) Independent Ambulation: (Patient-Rptd) Independent Medication Administration: (Patient-Rptd) Independent Home Management (perform basic housework or laundry): (Patient-Rptd) Independent Manage your own finances?: (Patient-Rptd) yes Primary transportation is: (Patient-Rptd) driving Concerns about vision?: (Patient-Rptd) no *vision screening is required for WTM* Concerns about hearing?: (Patient-Rptd) no  Fall Screening Falls in the past year?: 0 Number of falls in past year: 0 Was there an injury with Fall?: 0 Fall Risk Category Calculator: 0 Patient Fall Risk Level: Low Fall Risk  Fall Risk Patient at Risk for Falls Due to: No Fall Risks Fall risk Follow up: Falls evaluation completed  Home and Transportation Safety: All rugs  have non-skid backing?: (Patient-Rptd) yes All stairs or steps have railings?: (Patient-Rptd) yes Grab bars in the bathtub or shower?: (!) (Patient-Rptd) no Have non-skid surface in bathtub or shower?: (Patient-Rptd) yes Good home lighting?: (Patient-Rptd) yes Regular seat belt use?: (Patient-Rptd) yes Hospital stays in the last year:: (Patient-Rptd) no  Cognitive Assessment Difficulty concentrating, remembering, or making decisions? : (Patient-Rptd) no Will 6CIT or Mini Cog be Completed: no 6CIT or Mini Cog Declined: patient alert, oriented, able to answer questions appropriately and recall recent events  Advance Directives (For Healthcare) Does Patient Have a Medical Advance Directive?: Yes Does patient want to make changes to medical advance directive?: No - Patient declined Type of Advance Directive: Healthcare Power of [place]; Living will Copy of Healthcare Power of Attorney in Chart?: Yes - validated most recent copy scanned in chart (See row information) Copy of Living Will in Chart?: Yes - validated most recent copy scanned in chart (See row information)  Reviewed/Updated  Reviewed/Updated: Medical History; Surgical History; Family History; Medications; Allergies; Care Teams; Reviewed All (Medical, Surgical, Family, Medications, Allergies, Care Teams, Patient Goals); Patient Goals    Allergies (verified) Patient has no known allergies.   Current Medications (verified) Outpatient Encounter Medications as of 02/29/2024  Medication Sig   Ashwagandha 120 MG CAPS    Cholecalciferol (VITAMIN D3) 250 MCG (10000 UT) capsule Take 10,000 Units by mouth daily.   hydrochlorothiazide  (HYDRODIURIL ) 25 MG tablet Take 1 tablet (25 mg total) by mouth daily. for blood pressure.   Magnesium 100 MG TABS Take by mouth.   Multiple Vitamin (MULTIVITAMIN) tablet Take 1 tablet by mouth daily.   potassium chloride  SA (KLOR-CON  M) 20 MEQ tablet Take 1 tablet (20 mEq total) by mouth daily. For low  potassium.   [DISCONTINUED] amoxicillin -clavulanate (AUGMENTIN ) 875-125 MG tablet Take 1 tablet by mouth 2 (two) times daily.  No facility-administered encounter medications on file as of 02/29/2024.    History: Past Medical History:  Diagnosis Date   Anemia 2005   resolved in 2005   Arthritis 2018   bilateral thumbs   Basal cell carcinoma    Breast cancer (HCC) 2005   LEFT   Chest pain 04/16/2020   COVID-19 virus infection 01/29/2022   Edema    lower extremity    Frequent headaches    Hyperlipidemia    per my chart   Laryngitis 03/24/2021   Pain of left upper extremity 10/07/2017   Personal history of radiation therapy 2005   Left breast   Thyroid  disease 2000?   hashimoto's   Past Surgical History:  Procedure Laterality Date   ABDOMINAL HYSTERECTOMY  2006   partial   BASAL CELL CARCINOMA EXCISION  2012, 2015   BREAST BIOPSY Left 2005   Positive   BREAST BIOPSY Right 1998   neg   BREAST LUMPECTOMY Left 2005   COSMETIC SURGERY  2012   right breast reduction   EYE SURGERY  2009   lasik   FOOT SURGERY Left 2018   KNEE ARTHROSCOPY Left    MANDIBLE SURGERY     not related to TMJ-orthnatic surgery to assit with migraines   REDUCTION MAMMAPLASTY Right 2005   TOTAL VAGINAL HYSTERECTOMY  2006   Ovaries in place   TUBAL LIGATION  1996   Family History  Problem Relation Age of Onset   Heart attack Mother    Stroke Father    Glaucoma Father    Hypertension Father    Breast cancer Maternal Aunt    Early death Sister    Stroke Sister    Stroke Brother    Stroke Brother    Colon polyps Neg Hx    Colon cancer Neg Hx    Esophageal cancer Neg Hx    Rectal cancer Neg Hx    Prostate cancer Neg Hx    Social History   Occupational History   Not on file  Tobacco Use   Smoking status: Never   Smokeless tobacco: Never  Vaping Use   Vaping status: Never Used  Substance and Sexual Activity   Alcohol use: Yes    Alcohol/week: 1.0 standard drink of alcohol     Types: 1 Standard drinks or equivalent per week    Comment: 1 or less per week   Drug use: Never   Sexual activity: Not Currently   Tobacco Counseling Counseling given: Not Answered  SDOH Screenings   Food Insecurity: No Food Insecurity (02/28/2024)  Housing: Low Risk (02/28/2024)  Transportation Needs: No Transportation Needs (02/28/2024)  Utilities: Not At Risk (02/29/2024)  Alcohol Screen: Low Risk (02/28/2024)  Depression (PHQ2-9): Low Risk (02/29/2024)  Financial Resource Strain: Low Risk (02/28/2024)  Physical Activity: Sufficiently Active (02/28/2024)  Social Connections: Socially Isolated (02/28/2024)  Stress: No Stress Concern Present (02/28/2024)  Tobacco Use: Low Risk (02/29/2024)  Health Literacy: Adequate Health Literacy (02/29/2024)   See flowsheets for full screening details  Depression Screen PHQ 2 & 9 Depression Scale- Over the past 2 weeks, how often have you been bothered by any of the following problems? Little interest or pleasure in doing things: 0 Feeling down, depressed, or hopeless (PHQ Adolescent also includes...irritable): 0 PHQ-2 Total Score: 0 Trouble falling or staying asleep, or sleeping too much: 1 Feeling tired or having little energy: 0 Poor appetite or overeating (PHQ Adolescent also includes...weight loss): 0 Feeling bad about yourself - or that you  are a failure or have let yourself or your family down: 0 Trouble concentrating on things, such as reading the newspaper or watching television (PHQ Adolescent also includes...like school work): 0 Moving or speaking so slowly that other people could have noticed. Or the opposite - being so fidgety or restless that you have been moving around a lot more than usual: 0 Thoughts that you would be better off dead, or of hurting yourself in some way: 0 PHQ-9 Total Score: 1 If you checked off any problems, how difficult have these problems made it for you to do your work, take care of things at home, or get along with other  people?: Not difficult at all      Goals Addressed             This Visit's Progress    Prevent falls               Objective:    Today's Vitals   02/29/24 0848  BP: 118/66  Pulse: 78  Temp: 97.9 F (36.6 C)  TempSrc: Oral  SpO2: 100%  Weight: 128 lb 2 oz (58.1 kg)  Height: 5' 6.5 (1.689 m)   Body mass index is 20.37 kg/m.   Physical Exam    Hearing/Vision screen Hearing Screening   250Hz  500Hz  1000Hz  2000Hz  3000Hz  4000Hz  6000Hz  8000Hz   Right ear 10 10 10 20 25  35 25 25  Left ear 15 10 10 15 25 10 20 30    Vision Screening   Right eye Left eye Both eyes  Without correction     With correction 20/20 20/20 20/15    Immunizations and Health Maintenance Health Maintenance  Topic Date Due   Zoster Vaccines- Shingrix (1 of 2) 11/28/1977   Pneumococcal Vaccine: 50+ Years (1 of 1 - PCV) Never done   COVID-19 Vaccine (4 - 2025-26 season) 09/26/2023   Medicare Annual Wellness (AWV)  02/28/2025   Mammogram  04/21/2025   Colonoscopy  07/05/2027   DTaP/Tdap/Td (2 - Td or Tdap) 11/20/2028   Influenza Vaccine  Completed   Bone Density Scan  Completed   Hepatitis C Screening  Completed   HIV Screening  Completed   Hepatitis B Vaccines 19-59 Average Risk  Aged Out   Meningococcal B Vaccine  Aged Out    EKG: unchanged from previous tracings, normal sinus rhythm     Assessment/Plan:  This is a routine wellness examination for [place].  Patient Care Team: Gretta Comer POUR, NP as PCP - General (Internal Medicine)  I have personally reviewed and noted the following in the patients chart:   Medical and social history Use of alcohol, tobacco or illicit drugs  Current medications and supplements including opioid prescriptions. Functional ability and status Nutritional status Physical activity Advanced directives List of other physicians Hospitalizations, surgeries, and ER visits in previous 12 months Vitals Screenings to include cognitive, depression, and  falls Referrals and appointments  Orders Placed This Encounter  Procedures   DG Bone Density    Standing Status:   Future    Expiration Date:   02/28/2025    Reason for Exam (SYMPTOM  OR DIAGNOSIS REQUIRED):   estrogen deficiency    Preferred imaging location?:   [facility]   MM 3D SCREENING MAMMOGRAM BILATERAL BREAST    Standing Status:   Future    Expiration Date:   02/28/2025    Reason for Exam (SYMPTOM  OR DIAGNOSIS REQUIRED):   screening for breast cancer    Preferred imaging location?:  [facility]   Lipid panel   Comprehensive metabolic panel with GFR   TSH   VITAMIN D  25 Hydroxy (Vit-D Deficiency, Fractures)   CBC with Differential/Platelet   EKG 12-Lead   In addition, I have reviewed and discussed with patient certain preventive protocols, quality metrics, and best practice recommendations. A written personalized care plan for preventive services as well as general preventive health recommendations were provided to patient.   Lillyan Hitson K Santasia Rew, NP   02/29/2024   No follow-ups on file.  "

## 2024-02-29 NOTE — Addendum Note (Signed)
 Addended by: JACKOLYN PLANAS on: 02/29/2024 10:31 AM   Modules accepted: Orders

## 2024-02-29 NOTE — Assessment & Plan Note (Signed)
Repeat vitamin D level pending. 

## 2024-02-29 NOTE — Assessment & Plan Note (Signed)
 Immunizations UTD. Prevnar 20 provided today Mammogram and bone density scan due in March, orders placed. Colonoscopy UTD, due 2029  Discussed the importance of a healthy diet and regular exercise in order for weight loss, and to reduce the risk of further co-morbidity.  Exam stable. Labs pending.  Follow up in 1 year.

## 2024-02-29 NOTE — Assessment & Plan Note (Signed)
 Repeat lipid panel pending.  Work on a healthy diet and regular exercise in order for weight loss, and to reduce the risk of further co-morbidity.

## 2024-02-29 NOTE — Patient Instructions (Signed)
Stop by the lab prior to leaving today. I will notify you of your results once received.   [facility] to schedule your mammogram and bone density scan.   It was a pleasure to see you today!   

## 2024-02-29 NOTE — Assessment & Plan Note (Addendum)
 Controlled.  Continue hydrochlorothiazide  25 mg daily.  Continue potassium chloride  20 mEq daily. CMP pending.

## 2024-02-29 NOTE — Assessment & Plan Note (Signed)
Repeat TSH pending

## 2024-04-26 ENCOUNTER — Encounter

## 2024-04-26 ENCOUNTER — Other Ambulatory Visit
